# Patient Record
Sex: Male | Born: 1970 | Race: White | Hispanic: No | Marital: Single | Smoking: Former smoker
Health system: Southern US, Community
[De-identification: ages and names within clinical notes are randomized; demographics above are authoritative.]

## PROBLEM LIST (undated history)

## (undated) DIAGNOSIS — R3989 Other symptoms and signs involving the genitourinary system: Secondary | ICD-10-CM

## (undated) DIAGNOSIS — R51 Headache: Secondary | ICD-10-CM

## (undated) DIAGNOSIS — K219 Gastro-esophageal reflux disease without esophagitis: Secondary | ICD-10-CM

## (undated) DIAGNOSIS — E785 Hyperlipidemia, unspecified: Secondary | ICD-10-CM

## (undated) DIAGNOSIS — B019 Varicella without complication: Secondary | ICD-10-CM

## (undated) DIAGNOSIS — K121 Other forms of stomatitis: Secondary | ICD-10-CM

## (undated) DIAGNOSIS — E119 Type 2 diabetes mellitus without complications: Secondary | ICD-10-CM

## (undated) DIAGNOSIS — T7840XA Allergy, unspecified, initial encounter: Secondary | ICD-10-CM

## (undated) DIAGNOSIS — R519 Headache, unspecified: Secondary | ICD-10-CM

## (undated) HISTORY — DX: Gastro-esophageal reflux disease without esophagitis: K21.9

## (undated) HISTORY — DX: Headache: R51

## (undated) HISTORY — DX: Other symptoms and signs involving the genitourinary system: R39.89

## (undated) HISTORY — DX: Allergy, unspecified, initial encounter: T78.40XA

## (undated) HISTORY — DX: Type 2 diabetes mellitus without complications: E11.9

## (undated) HISTORY — DX: Headache, unspecified: R51.9

## (undated) HISTORY — DX: Varicella without complication: B01.9

## (undated) HISTORY — DX: Hyperlipidemia, unspecified: E78.5

## (undated) HISTORY — DX: Other forms of stomatitis: K12.1

---

## 2016-01-10 DIAGNOSIS — E1165 Type 2 diabetes mellitus with hyperglycemia: Secondary | ICD-10-CM | POA: Insufficient documentation

## 2016-01-10 DIAGNOSIS — E782 Mixed hyperlipidemia: Secondary | ICD-10-CM | POA: Insufficient documentation

## 2016-01-10 DIAGNOSIS — Z794 Long term (current) use of insulin: Secondary | ICD-10-CM

## 2016-01-10 DIAGNOSIS — IMO0001 Reserved for inherently not codable concepts without codable children: Secondary | ICD-10-CM | POA: Insufficient documentation

## 2017-12-20 ENCOUNTER — Telehealth: Payer: Self-pay | Admitting: Internal Medicine

## 2017-12-20 NOTE — Telephone Encounter (Signed)
Dr. Posey ReaPlotnikov, are you okay accepting this patient to establish care?

## 2017-12-20 NOTE — Telephone Encounter (Signed)
Copied from CRM [phone]. Topic: Appointment Scheduling - New Patient >> Dec 19, 2017  4:08 PM Windy KalataMichael, Taylor L, NT wrote: Patient is calling and states his friend Ree Edmanaul Giordano is a patient of Dr. Posey ReaPlotnikov and was told he would accept him as a new patient. Please contact patient to schedule.

## 2017-12-25 NOTE — Telephone Encounter (Signed)
Okay. Thank you.

## 2017-12-26 NOTE — Telephone Encounter (Signed)
Left message for patient to call back to schedule. Okay to schedule as an office visit in a 30 minute slot with note: New Patient/ okay per Dr Posey ReaPlotnikov

## 2018-01-25 ENCOUNTER — Ambulatory Visit: Payer: BC Managed Care – PPO | Admitting: Internal Medicine

## 2018-01-25 ENCOUNTER — Encounter: Payer: Self-pay | Admitting: Internal Medicine

## 2018-01-25 ENCOUNTER — Other Ambulatory Visit (INDEPENDENT_AMBULATORY_CARE_PROVIDER_SITE_OTHER): Payer: BC Managed Care – PPO

## 2018-01-25 VITALS — BP 122/76 | HR 81 | Temp 98.6°F | Ht 69.0 in | Wt 186.0 lb

## 2018-01-25 DIAGNOSIS — R5383 Other fatigue: Secondary | ICD-10-CM | POA: Insufficient documentation

## 2018-01-25 DIAGNOSIS — E1165 Type 2 diabetes mellitus with hyperglycemia: Secondary | ICD-10-CM

## 2018-01-25 DIAGNOSIS — R198 Other specified symptoms and signs involving the digestive system and abdomen: Secondary | ICD-10-CM | POA: Diagnosis not present

## 2018-01-25 DIAGNOSIS — IMO0001 Reserved for inherently not codable concepts without codable children: Secondary | ICD-10-CM

## 2018-01-25 DIAGNOSIS — N400 Enlarged prostate without lower urinary tract symptoms: Secondary | ICD-10-CM | POA: Diagnosis not present

## 2018-01-25 DIAGNOSIS — Z794 Long term (current) use of insulin: Secondary | ICD-10-CM | POA: Diagnosis not present

## 2018-01-25 DIAGNOSIS — Z23 Encounter for immunization: Secondary | ICD-10-CM

## 2018-01-25 LAB — URINALYSIS
Bilirubin Urine: NEGATIVE
HGB URINE DIPSTICK: NEGATIVE
Ketones, ur: NEGATIVE
LEUKOCYTES UA: NEGATIVE
NITRITE: NEGATIVE
Total Protein, Urine: NEGATIVE
UROBILINOGEN UA: 0.2 (ref 0.0–1.0)
Urine Glucose: NEGATIVE
pH: 5.5 (ref 5.0–8.0)

## 2018-01-25 LAB — CBC WITH DIFFERENTIAL/PLATELET
BASOS ABS: 0 10*3/uL (ref 0.0–0.1)
Basophils Relative: 1 % (ref 0.0–3.0)
Eosinophils Absolute: 0.2 10*3/uL (ref 0.0–0.7)
Eosinophils Relative: 4 % (ref 0.0–5.0)
HCT: 44.8 % (ref 39.0–52.0)
HEMOGLOBIN: 15 g/dL (ref 13.0–17.0)
LYMPHS ABS: 1.4 10*3/uL (ref 0.7–4.0)
LYMPHS PCT: 35.3 % (ref 12.0–46.0)
MCHC: 33.4 g/dL (ref 30.0–36.0)
MCV: 87.3 fl (ref 78.0–100.0)
MONOS PCT: 13.2 % — AB (ref 3.0–12.0)
Monocytes Absolute: 0.5 10*3/uL (ref 0.1–1.0)
NEUTROS PCT: 46.5 % (ref 43.0–77.0)
Neutro Abs: 1.8 10*3/uL (ref 1.4–7.7)
Platelets: 187 10*3/uL (ref 150.0–400.0)
RBC: 5.13 Mil/uL (ref 4.22–5.81)
RDW: 14.4 % (ref 11.5–15.5)
WBC: 3.9 10*3/uL — AB (ref 4.0–10.5)

## 2018-01-25 LAB — TSH: TSH: 2.09 u[IU]/mL (ref 0.35–4.50)

## 2018-01-25 LAB — BASIC METABOLIC PANEL
BUN: 19 mg/dL (ref 6–23)
CHLORIDE: 103 meq/L (ref 96–112)
CO2: 25 meq/L (ref 19–32)
Calcium: 10.6 mg/dL — ABNORMAL HIGH (ref 8.4–10.5)
Creatinine, Ser: 1 mg/dL (ref 0.40–1.50)
GFR: 84.87 mL/min (ref >60.00)
GLUCOSE: 118 mg/dL — AB (ref 70–99)
Potassium: 4.1 mEq/L (ref 3.5–5.1)
Sodium: 136 mEq/L (ref 135–145)

## 2018-01-25 LAB — HEPATIC FUNCTION PANEL
ALBUMIN: 4.7 g/dL (ref 3.5–5.2)
ALK PHOS: 57 U/L (ref 39–117)
ALT: 60 U/L — ABNORMAL HIGH (ref 0–53)
AST: 31 U/L (ref 0–37)
Bilirubin, Direct: 0.1 mg/dL (ref 0.0–0.3)
TOTAL PROTEIN: 7.2 g/dL (ref 6.0–8.3)
Total Bilirubin: 0.6 mg/dL (ref 0.2–1.2)

## 2018-01-25 LAB — VITAMIN B12: VITAMIN B 12: 936 pg/mL — AB (ref 211–911)

## 2018-01-25 LAB — VITAMIN D 25 HYDROXY (VIT D DEFICIENCY, FRACTURES): VITD: 39.27 ng/mL (ref 30.00–100.00)

## 2018-01-25 LAB — T4, FREE: Free T4: 0.85 ng/dL (ref 0.60–1.60)

## 2018-01-25 LAB — PSA: PSA: 0.94 ng/mL (ref 0.10–4.00)

## 2018-01-25 LAB — SEDIMENTATION RATE: SED RATE: 12 mm/h (ref 0–15)

## 2018-01-25 MED ORDER — DOXYCYCLINE HYCLATE 100 MG PO TABS: 100.0000 mg | ORAL_TABLET | Freq: Two times a day (BID) | ORAL | 0 refills | Status: DC

## 2018-01-25 MED ORDER — ALIGN 4 MG PO CAPS: 1.0000 | ORAL_CAPSULE | Freq: Every day | ORAL | 0 refills | Status: DC

## 2018-01-25 NOTE — Assessment & Plan Note (Signed)
DM 2 2010 Dr Allena KatzPatel Insulin 2011

## 2018-01-25 NOTE — Progress Notes (Signed)
Subjective:  Patient ID: Mitchell Gutierrez, male    DOB: 26-May-1971  Age: 47 y.o. MRN: [id number]  CC: No chief complaint on file.   HPI Giovanni Chrzanowski presents to est [facility] C/o pain in the L groin x 2 months C/o abd bloating C/o prostatitis sx's (h/o prostatitis): urgency, dribbling  C/0 DM2   Outpatient Medications Prior to Visit  Medication Sig Dispense Refill  . Chlorpheniramine Maleate (ALLERGY PO) Take by mouth.    . cholecalciferol (VITAMIN D) 1000 units tablet Take 1,000 Units by mouth daily.    . Dulaglutide 0.75 MG/0.5ML SOPN Inject into the skin.    Marland Kitchen ECHINACEA PO Take by mouth.    Marland Kitchen ibuprofen (ADVIL,MOTRIN) 200 MG tablet Take 200 mg by mouth every 6 (six) hours as needed.    . insulin detemir (LEVEMIR) 100 UNIT/ML injection Inject into the skin.    . Multiple Vitamins-Minerals (ZINC PO) Take by mouth.    . Omega-3 Fatty Acids (FISH OIL PO) Take by mouth.    . vitamin B-12 (CYANOCOBALAMIN) 1000 MCG tablet Take 1,000 mcg by mouth daily.     No facility-administered medications prior to visit.     ROS Review of Systems  Objective:  BP 122/76 (BP Location: Left Arm, Patient Position: Sitting, Cuff Size: Large)   Pulse 81   Temp 98.6 F (37 C) (Oral)   Ht 5\' 9"  (1.753 m)   Wt 186 lb (84.4 kg)   SpO2 98%   BMI 27.47 kg/m   BP Readings from Last 3 Encounters:  01/25/18 122/76    Wt Readings from Last 3 Encounters:  01/25/18 186 lb (84.4 kg)    Physical Exam  No results found for: WBC, HGB, HCT, PLT, GLUCOSE, CHOL, TRIG, HDL, LDLDIRECT, LDLCALC, ALT, AST, NA, K, CL, CREATININE, BUN, CO2, TSH, PSA, INR, GLUF, HGBA1C, MICROALBUR  Patient was never admitted.  Assessment & Plan:   There are no diagnoses linked to this encounter. I am having Harel Emory "Alinda Money" maintain his Dulaglutide, insulin detemir, Multiple Vitamins-Minerals (ZINC PO), vitamin B-12, cholecalciferol, Omega-3 Fatty Acids (FISH OIL PO), Chlorpheniramine Maleate (ALLERGY PO), ibuprofen, and  ECHINACEA PO.  No orders of the defined types were placed in this encounter.    Follow-up: No follow-ups on file.  Sonda Primes, MD

## 2018-01-25 NOTE — Assessment & Plan Note (Signed)
No OSA 

## 2018-01-25 NOTE — Assessment & Plan Note (Signed)
Doxy  X 2 wks

## 2018-01-25 NOTE — Patient Instructions (Signed)
  Gluten free trial for 4-6 weeks. OK to use gluten-free bread and gluten-free pasta.    Gluten-Free Diet for Celiac Disease, Adult The gluten-free diet includes all foods that do not contain gluten. Gluten is a protein that is found in wheat, rye, barley, and some other grains. Following the gluten-free diet is the only treatment for people with celiac disease. It helps to prevent damage to the intestines and improves or eliminates the symptoms of celiac disease. Following the gluten-free diet requires some planning. It can be challenging at first, but it gets easier with time and practice. There are more gluten-free options available today than ever before. If you need help finding gluten-free foods or if you have questions, talk with your diet and nutrition specialist (registered dietitian) or your health care provider. What do I need to know about a gluten-free diet?  All fruits, vegetables, and meats are safe to eat and do not contain gluten.  When grocery shopping, start by shopping in the produce, meat, and dairy sections. These sections are more likely to contain gluten-free foods. Then move to the aisles that contain packaged foods if you need to.  Read all food labels. Gluten is often added to foods. Always check the ingredient list and look for warnings, such as "may contain gluten."  Talk with your dietitian or health care provider before taking a gluten-free multivitamin or mineral supplement.  Be aware of gluten-free foods having contact with foods that contain gluten (cross-contamination). This can happen at home and with any processed foods. ? Talk with your health care provider or dietitian about how to reduce the risk of cross-contamination in your home. ? If you have questions about how a food is processed, ask the manufacturer. What key words help to identify gluten? Foods that list any of these key words on the label usually contain gluten:  Wheat, flour, enriched  flour, bromated flour, white flour, durum flour, graham flour, phosphated flour, self-rising flour, semolina, farina, barley (malt), rye, and oats.  Starch, dextrin, modified food starch, or cereal.  Thickening, fillers, or emulsifiers.  Malt flavoring, malt extract, or malt syrup.  Hydrolyzed vegetable protein.  In the [place] packaged foods that are gluten-free are required to be labeled "GF." These foods should be easy to identify and are safe to eat. In the [place] food companies are also required to list common food allergens, including wheat, on their labels. Recommended foods Grains  Amaranth, bean flours, 100% buckwheat flour, corn, millet, nut flours or nut meals, GF oats, quinoa, rice, sorghum, teff, rice wafers, pure cornmeal tortillas, popcorn, and hot cereals made from cornmeal. Hominy, rice, wild rice. Some Asian rice noodles or bean noodles. Arrowroot starch, corn bran, corn flour, corn germ, cornmeal, corn starch, potato flour, potato starch flour, and rice bran. Plain, brown, and sweet rice flours. Rice polish, soy flour, and tapioca starch. Vegetables  All plain fresh, frozen, and canned vegetables. Fruits  All plain fresh, frozen, canned, and dried fruits, and 100% fruit juices. Meats and other protein foods  All fresh beef, pork, poultry, fish, seafood, and eggs. Fish canned in water, oil, brine, or vegetable broth. Plain nuts and seeds, peanut butter. Some lunch meat and some frankfurters. Dried beans, dried peas, and lentils. Dairy  Fresh plain, dry, evaporated, or condensed milk. Cream, butter, sour cream, whipping cream, and most yogurts. Unprocessed cheese, most processed cheeses, some cottage cheese, some cream cheeses. Beverages  Coffee, tea, most herbal teas. Carbonated beverages and some root beers.   Wine, sake, and distilled spirits, such as gin, vodka, and whiskey. Most hard ciders. Fats and oils  Butter, margarine, vegetable oil, hydrogenated butter, olive  oil, shortening, lard, cream, and some mayonnaise. Some commercial salad dressings. Olives. Sweets and desserts  Sugar, honey, some syrups, molasses, jelly, and jam. Plain hard candy, marshmallows, and gumdrops. Pure cocoa powder. Plain chocolate. Custard and some pudding mixes. Gelatin desserts, sorbets, frozen ice pops, and sherbet. Cake, cookies, and other desserts prepared with allowed flours. Some commercial ice creams. Cornstarch, tapioca, and rice puddings. Seasoning and other foods  Some canned or frozen soups. Monosodium glutamate (MSG). Cider, rice, and wine vinegar. Baking soda and baking powder. Cream of tartar. Baking and nutritional yeast. Certain soy sauces made without wheat (ask your dietitian about specific brands that are allowed). Nuts, coconut, and chocolate. Salt, pepper, herbs, spices, flavoring extracts, imitation or artificial flavorings, natural flavorings, and food colorings. Some medicines and supplements. Some lip glosses and other cosmetics. Rice syrups. The items listed may not be a complete list. Talk with your dietitian about what dietary choices are best for you. Foods to avoid Grains  Barley, bran, bulgur, couscous, cracked wheat, [place], farro, graham, malt, matzo, semolina, wheat germ, and all wheat and rye cereals including spelt and kamut. Cereals containing malt as a flavoring, such as rice cereal. Noodles, spaghetti, macaroni, most packaged rice mixes, and all mixes containing wheat, rye, barley, or triticale. Vegetables  Most creamed vegetables and most vegetables canned in sauces. Some commercially prepared vegetables and salads. Fruits  Thickened or prepared fruits and some pie fillings. Some fruit snacks and fruit roll-ups. Meats and other protein foods  Any meat or meat alternative containing wheat, rye, barley, or gluten stabilizers. These are often marinated or packaged meats and lunch meats. Bread-containing products, such as Swiss steak,  croquettes, meatballs, and meatloaf. Most tuna canned in vegetable broth and turkey with hydrolyzed vegetable protein (HVP) injected as part of the basting. Seitan. Imitation fish. Eggs in sauces made from ingredients to avoid. Dairy  Commercial chocolate milk drinks and malted milk. Some non-dairy creamers. Any cheese product containing ingredients to avoid. Beverages  Certain cereal beverages. Beer, ale, malted milk, and some root beers. Some hard ciders. Some instant flavored coffees. Some herbal teas made with barley or with barley malt added. Fats and oils  Some commercial salad dressings. Sour cream containing modified food starch. Sweets and desserts  Some toffees. Chocolate-coated nuts (may be rolled in wheat flour) and some commercial candies and candy bars. Most cakes, cookies, donuts, pastries, and other baked goods. Some commercial ice cream. Ice cream cones. Commercially prepared mixes for cakes, cookies, and other desserts. Bread pudding and other puddings thickened with flour. Products containing brown rice syrup made with barley malt enzyme. Desserts and sweets made with malt flavoring. Seasoning and other foods  Some curry powders, some dry seasoning mixes, some gravy extracts, some meat sauces, some ketchups, some prepared mustards, and horseradish. Certain soy sauces. Malt vinegar. Bouillon and bouillon cubes that contain HVP. Some chip dips, and some chewing gum. Yeast extract. Brewer's yeast. Caramel color. Some medicines and supplements. Some lip glosses and other cosmetics. The items listed may not be a complete list. Talk with your dietitian about what dietary choices are best for you. Summary  Gluten is a protein that is found in wheat, rye, barley, and some other grains. The gluten-free diet includes all foods that do not contain gluten.  If you need help finding gluten-free foods or if   you have questions, talk with your diet and nutrition specialist (registered  dietitian) or your health care provider.  Read all food labels. Gluten is often added to foods. Always check the ingredient list and look for warnings, such as "may contain gluten." This information is not intended to replace advice given to you by your health care provider. Make sure you discuss any questions you have with your health care provider. Document Released: 09/27/2005 Document Revised: 07/12/2016 Document Reviewed: 07/12/2016 Elsevier Interactive Patient Education  2018 Elsevier Inc.  Lactose Intolerance, Adult Lactose is the natural sugar found in milk and milk products, such as cheese and yogurt. Lactose is digested by lactase, an enzyme in your small intestine. Some people do not produce enough lactase to digest lactose. This is called lactose intolerance. Lactose intolerance is different from milk allergy, which is a more serious reaction to the protein in milk. What are the causes? Causes of lactose intolerance may include:  Normal aging. The ability to produce lactase may decline with age, causing lactose intolerance over time.  Being born without the ability to make lactase.  Digestive diseases such as gastroenteritis or inflammatory bowel disease.  Surgery or injuries to your small intestine.  Infection in your intestines.  Certain antibiotic medicines and cancer treatments.  What are the signs or symptoms? Lactose intolerance can cause uncomfortable symptoms. These are likely to occur within 30 minutes to 2 hours after eating or drinking foods containing lactose. Symptoms of lactose intolerance may include:  Nausea.  Diarrhea.  Abdominal cramps or pain.  Bloating.  Gas.  How is this diagnosed? There are several tests your health care provider can do to diagnose lactose intolerance. These tests include a hydrogen breath test and stool acidity test. How is this treated? No treatment can improve your body's ability to produce lactase. However, your symptoms  can be controlled by limiting or avoiding milk products and other sources of lactose and adjusting your diet. Lactose-free milk is often tolerated. Lactose digestion may also be improved by adding lactase drops to regular milk or by taking lactase tablets when dairy products are consumed. Tolerance to lactose is individual. Some people may be able to eat or drink small amounts of products with lactose, while other may need to avoid lactose entirely. Talk to your health care provider about what is best for you. Follow these instructions at home:  Limit or avoidfoods, beverages, and medicines containing lactose as directed by your health care provider.  Read food and medicine labels carefully to avoid products containing lactose, milk solids, casein, or whey.  If you eliminate dairy products, replace the protein, calcium, vitamin D, and other nutrients they contain through other foods. A registered dietitian or your health care provider can help you adjust your diet.  Choose a milk substitute that is fortified with calcium and vitamin D. Be aware that soy milk contains high quality protein, while milks made from nuts or grains contain very little protein.  Use lactase drops or tablets if directed by your health care provider. Contact a health care provider if: You have no relief from your symptoms after eliminating milk products and other sources of lactose. This information is not intended to replace advice given to you by your health care provider. Make sure you discuss any questions you have with your health care provider. Document Released: 09/27/2005 Document Revised: 03/04/2016 Document Reviewed: 12/28/2013 Elsevier Interactive Patient Education  2018 Elsevier Inc.  

## 2018-01-25 NOTE — Assessment & Plan Note (Signed)
?  etiology Labs 

## 2018-01-26 LAB — HIV ANTIBODY (ROUTINE TESTING W REFLEX): HIV: NONREACTIVE

## 2018-01-31 ENCOUNTER — Other Ambulatory Visit: Payer: Self-pay | Admitting: Internal Medicine

## 2018-01-31 DIAGNOSIS — R7989 Other specified abnormal findings of blood chemistry: Secondary | ICD-10-CM

## 2018-01-31 DIAGNOSIS — R945 Abnormal results of liver function studies: Secondary | ICD-10-CM

## 2018-01-31 DIAGNOSIS — R5383 Other fatigue: Secondary | ICD-10-CM

## 2018-01-31 LAB — GC/CHLAMYDIA PROBE AMP
Chlamydia trachomatis, NAA: NEGATIVE
Neisseria gonorrhoeae by PCR: NEGATIVE

## 2018-01-31 LAB — SPECIMEN STATUS REPORT

## 2018-01-31 NOTE — Addendum Note (Signed)
Addended by: Scarlett PrestoFRIEDENBACH, Charlye Spare on: 01/31/2018 01:42 PM   Modules accepted: Orders

## 2018-02-21 ENCOUNTER — Ambulatory Visit
Admission: RE | Admit: 2018-02-21 | Discharge: 2018-02-21 | Disposition: A | Discharge status: Home / Self Care | Payer: BC Managed Care – PPO | Source: Ambulatory Visit | Attending: Internal Medicine | Admitting: Internal Medicine

## 2018-02-21 DIAGNOSIS — R945 Abnormal results of liver function studies: Principal | ICD-10-CM

## 2018-02-21 DIAGNOSIS — R7989 Other specified abnormal findings of blood chemistry: Secondary | ICD-10-CM

## 2018-03-31 ENCOUNTER — Ambulatory Visit: Payer: BC Managed Care – PPO | Admitting: Internal Medicine

## 2018-04-19 ENCOUNTER — Encounter: Payer: Self-pay | Admitting: Internal Medicine

## 2018-04-19 DIAGNOSIS — E785 Hyperlipidemia, unspecified: Secondary | ICD-10-CM

## 2018-04-20 ENCOUNTER — Other Ambulatory Visit: Payer: Self-pay

## 2018-04-20 ENCOUNTER — Other Ambulatory Visit (INDEPENDENT_AMBULATORY_CARE_PROVIDER_SITE_OTHER): Payer: BC Managed Care – PPO

## 2018-04-20 DIAGNOSIS — R7989 Other specified abnormal findings of blood chemistry: Secondary | ICD-10-CM

## 2018-04-20 DIAGNOSIS — Z794 Long term (current) use of insulin: Secondary | ICD-10-CM | POA: Diagnosis not present

## 2018-04-20 DIAGNOSIS — R945 Abnormal results of liver function studies: Secondary | ICD-10-CM

## 2018-04-20 DIAGNOSIS — E1165 Type 2 diabetes mellitus with hyperglycemia: Secondary | ICD-10-CM

## 2018-04-20 DIAGNOSIS — R5383 Other fatigue: Secondary | ICD-10-CM | POA: Diagnosis not present

## 2018-04-20 DIAGNOSIS — IMO0001 Reserved for inherently not codable concepts without codable children: Secondary | ICD-10-CM

## 2018-04-20 DIAGNOSIS — E785 Hyperlipidemia, unspecified: Secondary | ICD-10-CM

## 2018-04-20 LAB — BASIC METABOLIC PANEL
BUN: 26 mg/dL — AB (ref 6–23)
CO2: 23 meq/L (ref 19–32)
Calcium: 10 mg/dL (ref 8.4–10.5)
Chloride: 106 mEq/L (ref 96–112)
Creatinine, Ser: 1.04 mg/dL (ref 0.40–1.50)
GFR: 81.04 mL/min (ref >60.00)
GLUCOSE: 161 mg/dL — AB (ref 70–99)
POTASSIUM: 4.3 meq/L (ref 3.5–5.1)
Sodium: 137 mEq/L (ref 135–145)

## 2018-04-20 LAB — HEPATIC FUNCTION PANEL
ALT: 31 U/L (ref 0–53)
AST: 17 U/L (ref 0–37)
Albumin: 4.4 g/dL (ref 3.5–5.2)
Alkaline Phosphatase: 56 U/L (ref 39–117)
Bilirubin, Direct: 0 mg/dL (ref 0.0–0.3)
Total Bilirubin: 0.5 mg/dL (ref 0.2–1.2)
Total Protein: 6.8 g/dL (ref 6.0–8.3)

## 2018-04-20 LAB — LIPID PANEL
Cholesterol: 218 mg/dL — ABNORMAL HIGH (ref 0–200)
HDL: 24.2 mg/dL — ABNORMAL LOW (ref >39.00)
Total CHOL/HDL Ratio: 9
Triglycerides: 777 mg/dL — ABNORMAL HIGH (ref 0.0–149.0)

## 2018-04-20 LAB — LDL CHOLESTEROL, DIRECT: Direct LDL: 57 mg/dL

## 2018-04-20 LAB — HEMOGLOBIN A1C: HEMOGLOBIN A1C: 7.8 % — AB (ref 4.6–6.5)

## 2018-04-24 ENCOUNTER — Encounter: Payer: Self-pay | Admitting: Internal Medicine

## 2018-04-24 ENCOUNTER — Ambulatory Visit: Payer: BC Managed Care – PPO | Admitting: Internal Medicine

## 2018-04-24 DIAGNOSIS — IMO0001 Reserved for inherently not codable concepts without codable children: Secondary | ICD-10-CM

## 2018-04-24 DIAGNOSIS — E1165 Type 2 diabetes mellitus with hyperglycemia: Secondary | ICD-10-CM | POA: Diagnosis not present

## 2018-04-24 DIAGNOSIS — Z794 Long term (current) use of insulin: Secondary | ICD-10-CM

## 2018-04-24 DIAGNOSIS — E782 Mixed hyperlipidemia: Secondary | ICD-10-CM | POA: Diagnosis not present

## 2018-04-24 MED ORDER — METFORMIN HCL ER (MOD) 500 MG PO TB24: 500.0000 mg | ORAL_TABLET | Freq: Every day | ORAL | 11 refills | Status: DC

## 2018-04-24 MED ORDER — ICOSAPENT ETHYL 1 G PO CAPS: 2.0000 | ORAL_CAPSULE | Freq: Two times a day (BID) | ORAL | 11 refills | Status: DC

## 2018-04-24 NOTE — Assessment & Plan Note (Addendum)
Elev TG Start Vascepa Added Metformin

## 2018-04-24 NOTE — Assessment & Plan Note (Addendum)
Added Metformin Insulin; Dulaglutide

## 2018-04-24 NOTE — Progress Notes (Signed)
Subjective:  Patient ID: Mitchell Gutierrez, male    DOB: 06-Oct-1971  Age: 47 y.o. MRN: [id number]  CC: No chief complaint on file.   HPI Mitchell Gutierrez presents for DM, elev TG  Outpatient Medications Prior to Visit  Medication Sig Dispense Refill  . Chlorpheniramine Maleate (ALLERGY PO) Take by mouth.    . cholecalciferol (VITAMIN D) 1000 units tablet Take 1,000 Units by mouth daily.    Marland Kitchen doxycycline (VIBRA-TABS) 100 MG tablet Take 1 tablet (100 mg total) by mouth 2 (two) times daily. 28 tablet 0  . Dulaglutide 0.75 MG/0.5ML SOPN Inject into the skin.    Marland Kitchen ECHINACEA PO Take by mouth.    Marland Kitchen ibuprofen (ADVIL,MOTRIN) 200 MG tablet Take 200 mg by mouth every 6 (six) hours as needed.    . insulin detemir (LEVEMIR) 100 UNIT/ML injection Inject into the skin.    . Multiple Vitamins-Minerals (ZINC PO) Take by mouth.    . Omega-3 Fatty Acids (FISH OIL PO) Take by mouth.    . Probiotic Product (ALIGN) 4 MG CAPS Take 1 capsule (4 mg total) by mouth daily. 30 capsule 0  . vitamin B-12 (CYANOCOBALAMIN) 1000 MCG tablet Take 1,000 mcg by mouth daily.     No facility-administered medications prior to visit.     ROS: Review of Systems  Constitutional: Negative for appetite change, fatigue and unexpected weight change.  HENT: Negative for congestion, nosebleeds, sneezing, sore throat and trouble swallowing.   Eyes: Negative for itching and visual disturbance.  Respiratory: Negative for cough.   Cardiovascular: Negative for chest pain, palpitations and leg swelling.  Gastrointestinal: Negative for abdominal distention, blood in stool, diarrhea and nausea.  Genitourinary: Negative for frequency and hematuria.  Musculoskeletal: Negative for back pain, gait problem, joint swelling and neck pain.  Skin: Negative for rash.  Neurological: Negative for dizziness, tremors, speech difficulty and weakness.  Psychiatric/Behavioral: Negative for agitation, dysphoric mood and sleep disturbance. The patient is  not nervous/anxious.     Objective:  BP 128/82 (BP Location: Left Arm, Patient Position: Sitting, Cuff Size: Normal)   Pulse 76   Temp 98.8 F (37.1 C) (Oral)   Ht 5\' 9"  (1.753 m)   Wt 186 lb (84.4 kg)   SpO2 94%   BMI 27.47 kg/m   BP Readings from Last 3 Encounters:  04/24/18 128/82  01/25/18 122/76    Wt Readings from Last 3 Encounters:  04/24/18 186 lb (84.4 kg)  01/25/18 186 lb (84.4 kg)    Physical Exam  Constitutional: He is oriented to person, place, and time. He appears well-developed. No distress.  NAD  HENT:  Mouth/Throat: Oropharynx is clear and moist.  Eyes: Pupils are equal, round, and reactive to light. Conjunctivae are normal.  Neck: Normal range of motion. No JVD present. No thyromegaly present.  Cardiovascular: Normal rate, regular rhythm, normal heart sounds and intact distal pulses. Exam reveals no gallop and no friction rub.  No murmur heard. Pulmonary/Chest: Effort normal and breath sounds normal. No respiratory distress. He has no wheezes. He has no rales. He exhibits no tenderness.  Abdominal: Soft. Bowel sounds are normal. He exhibits no distension and no mass. There is no tenderness. There is no rebound and no guarding.  Musculoskeletal: Normal range of motion. He exhibits no edema or tenderness.  Lymphadenopathy:    He has no cervical adenopathy.  Neurological: He is alert and oriented to person, place, and time. He has normal reflexes. No cranial nerve deficit. He exhibits normal muscle  tone. He displays a negative Romberg sign. Coordination and gait normal.  Skin: Skin is warm and dry. No rash noted.  Psychiatric: He has a normal mood and affect. His behavior is normal. Judgment and thought content normal.    Lab Results  Component Value Date   WBC 3.9 (L) 01/25/2018   HGB 15.0 01/25/2018   HCT 44.8 01/25/2018   PLT 187.0 01/25/2018   GLUCOSE 161 (H) 04/20/2018   CHOL 218 (H) 04/20/2018   TRIG (H) 04/20/2018    777.0 Triglyceride is over  400; calculations on Lipids are invalid.   HDL 24.20 (L) 04/20/2018   LDLDIRECT 57.0 04/20/2018   ALT 31 04/20/2018   AST 17 04/20/2018   NA 137 04/20/2018   K 4.3 04/20/2018   CL 106 04/20/2018   CREATININE 1.04 04/20/2018   BUN 26 (H) 04/20/2018   CO2 23 04/20/2018   TSH 2.09 01/25/2018   PSA 0.94 01/25/2018   HGBA1C 7.8 (H) 04/20/2018    [place] Abdomen Limited Ruq  Result Date: 02/21/2018 CLINICAL DATA:  Elevated liver function studies EXAM: ULTRASOUND ABDOMEN LIMITED RIGHT UPPER QUADRANT COMPARISON:  None. FINDINGS: Gallbladder: No gallstones or wall thickening visualized. No sonographic Murphy sign noted by sonographer. Common bile duct: Diameter: 2.2 mm Liver: The hepatic echotexture is increased diffusely. The surface contour is smooth. There is no focal mass nor ductal dilation. Portal vein is patent on color Doppler imaging with normal direction of blood flow towards the liver. IMPRESSION: Increased hepatic echotexture most compatible with fatty infiltrative change. Normal appearance of the gallbladder and common bile duct. Electronically Signed   By: David  SwazilandJordan M.D.   On: 02/21/2018 12:43    Assessment & Plan:   There are no diagnoses linked to this encounter.   No orders of the defined types were placed in this encounter.    Follow-up: No follow-ups on file.  Sonda PrimesAlex Plotnikov, MD

## 2018-05-03 ENCOUNTER — Other Ambulatory Visit: Payer: BC Managed Care – PPO | Admitting: Internal Medicine

## 2018-07-24 ENCOUNTER — Ambulatory Visit: Payer: BC Managed Care – PPO | Admitting: Internal Medicine

## 2018-08-11 ENCOUNTER — Telehealth: Payer: Self-pay

## 2018-08-11 NOTE — Telephone Encounter (Signed)
PA approved 07/14/18-07/15/19

## 2019-03-20 DIAGNOSIS — H5203 Hypermetropia, bilateral: Secondary | ICD-10-CM | POA: Insufficient documentation

## 2019-03-20 DIAGNOSIS — H524 Presbyopia: Secondary | ICD-10-CM | POA: Insufficient documentation

## 2019-03-20 DIAGNOSIS — Z794 Long term (current) use of insulin: Secondary | ICD-10-CM | POA: Insufficient documentation

## 2019-04-08 IMAGING — US US ABDOMEN LIMITED
1 series · 14 of 25 positions shown · non-contrast
Comparison: None.

CLINICAL DATA: Elevated liver function studies

EXAM:
ULTRASOUND ABDOMEN LIMITED RIGHT UPPER QUADRANT

[Series 1: us abdomen limited · 0.28mm/px · 14 of 41 slices shown]
[im 1/41]
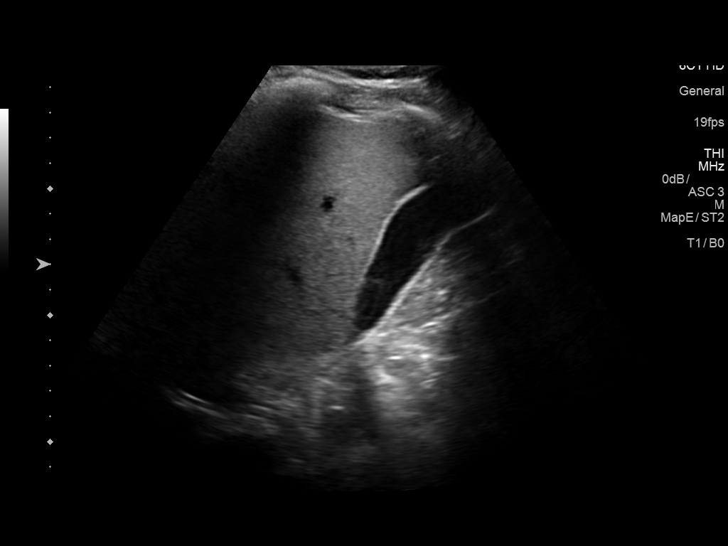
[im 4/41]
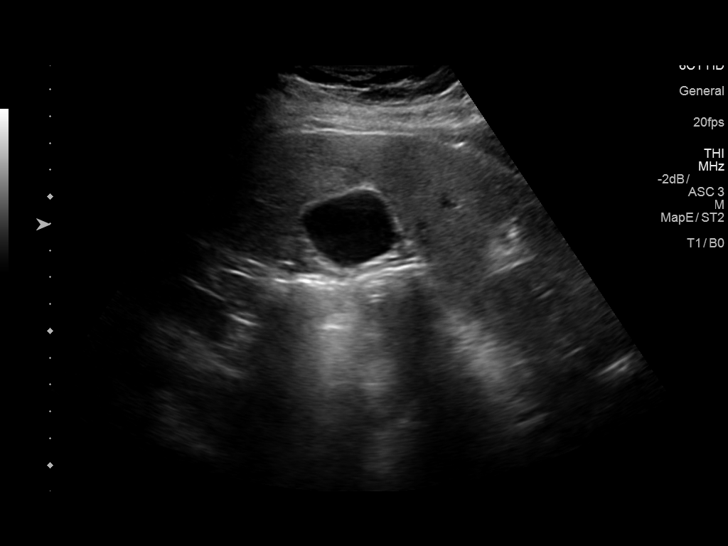
[im 7/41]
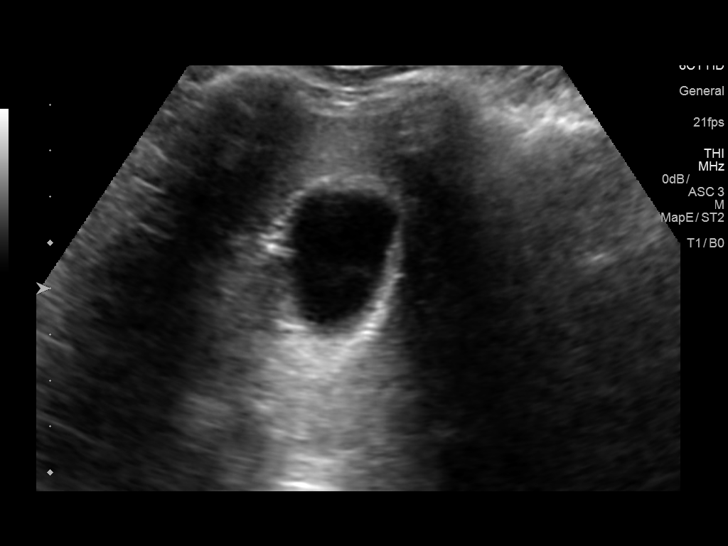
[im 11/41]
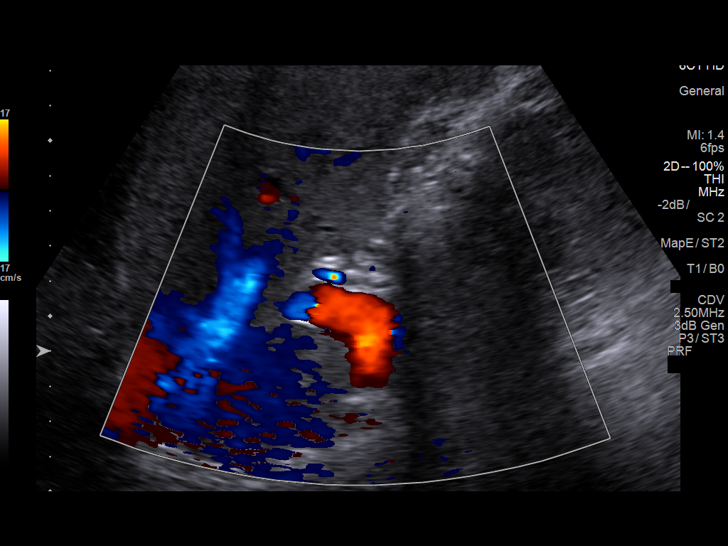
[im 14/41]
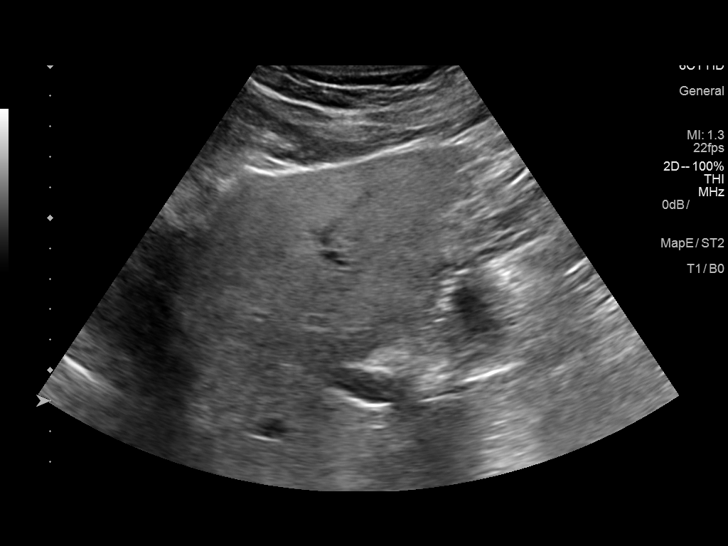
[im 16/41]
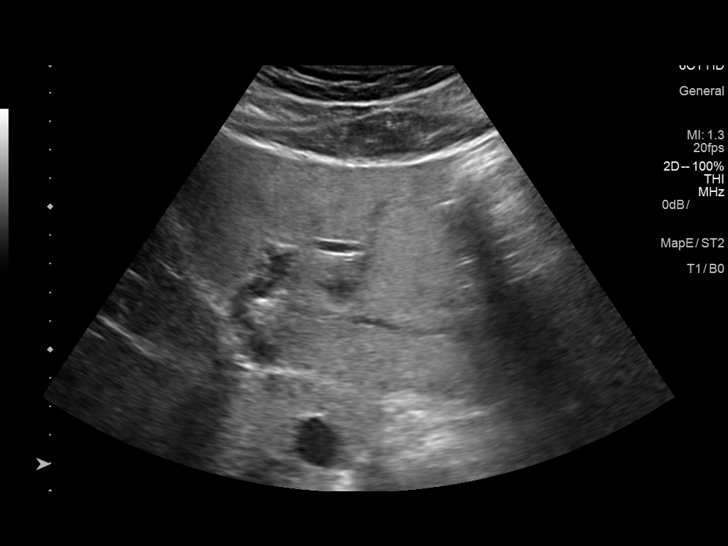
[im 19/41]
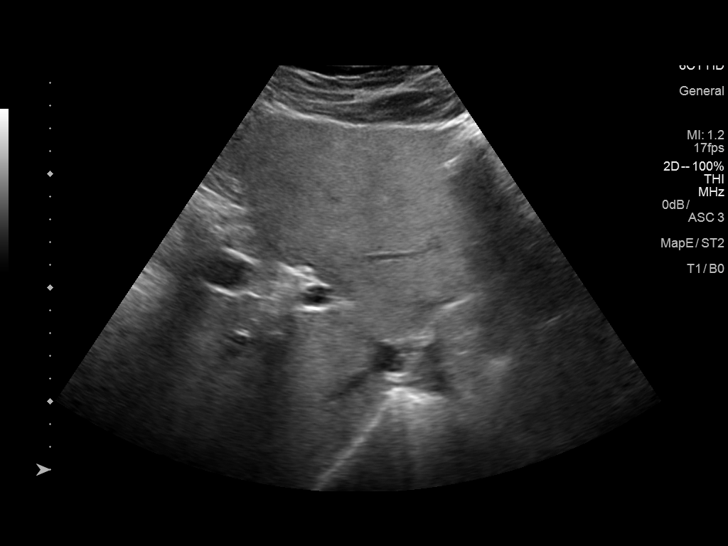
[im 22/41]
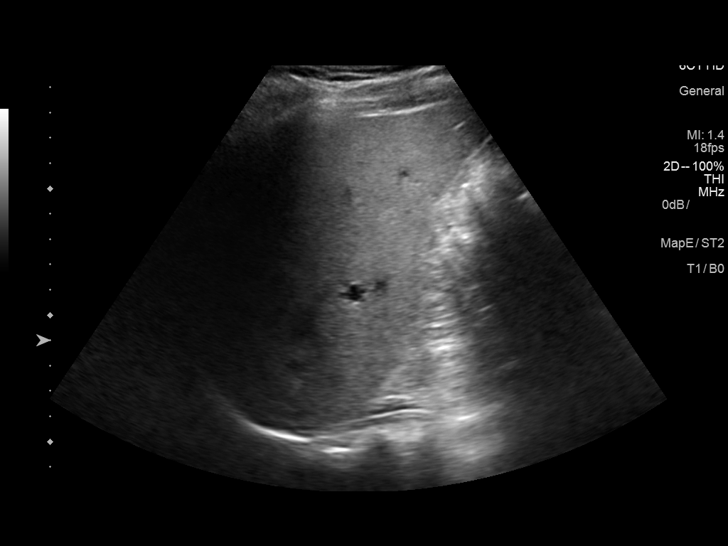
[im 26/41]
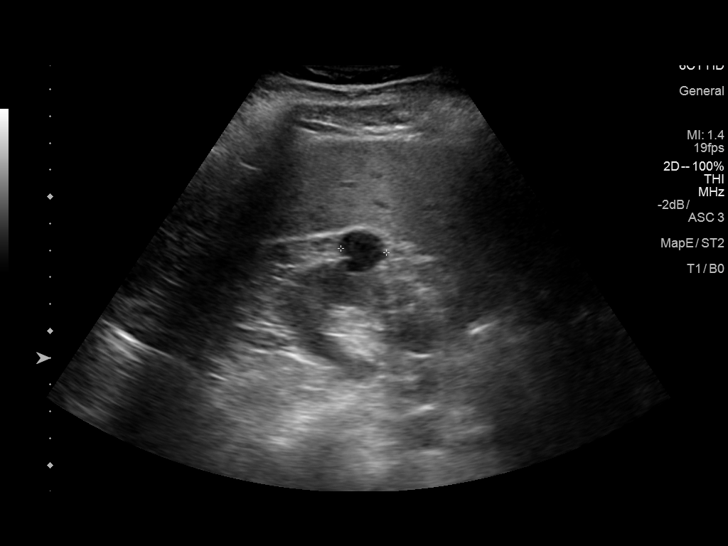
[im 27/41]
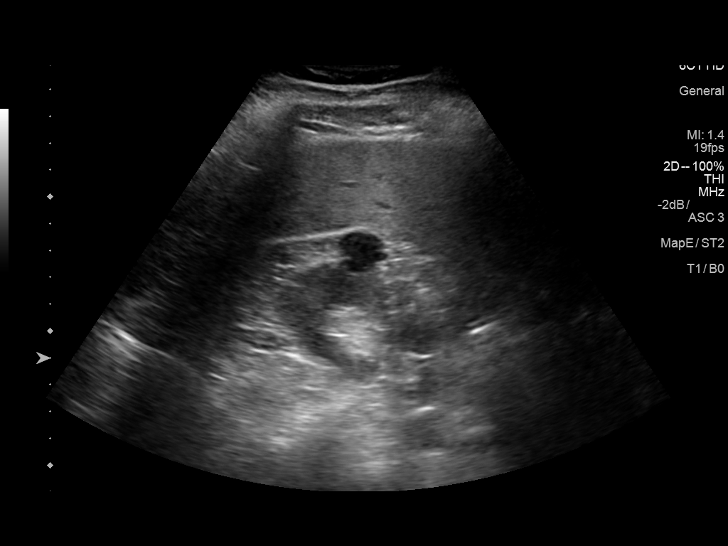
[im 31/41]
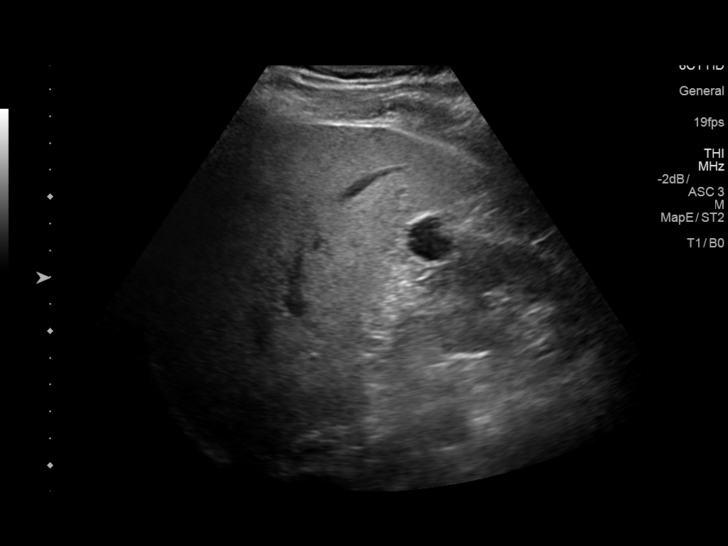
[im 34/41]
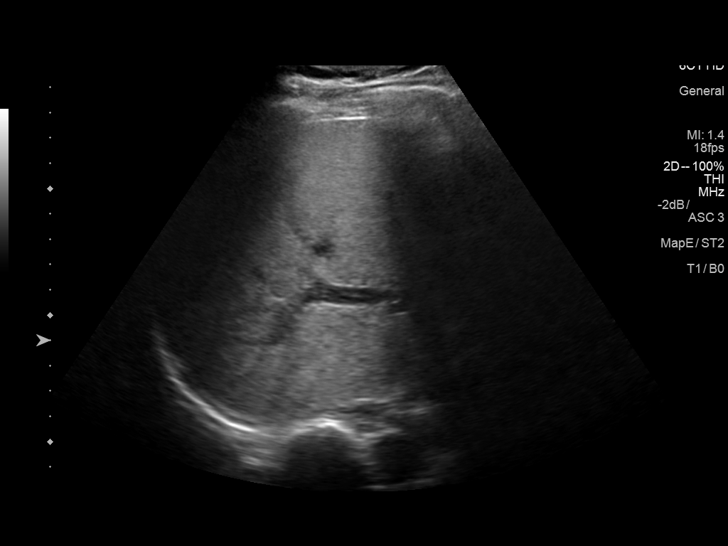
[im 37/41]
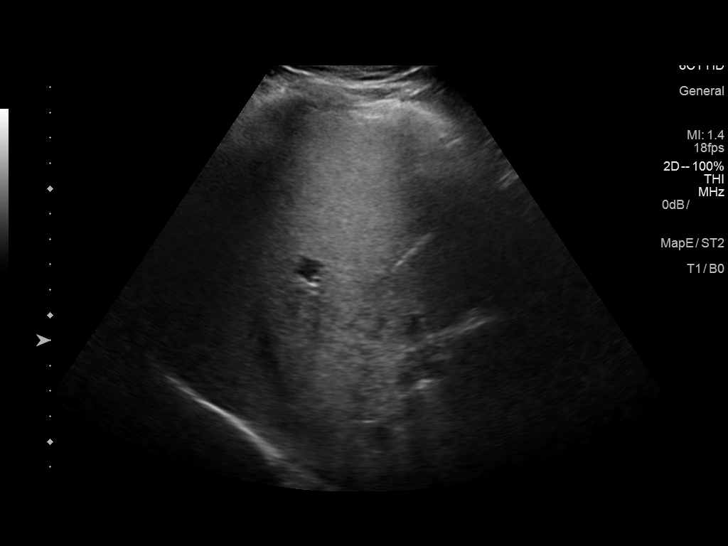
[im 41/41]
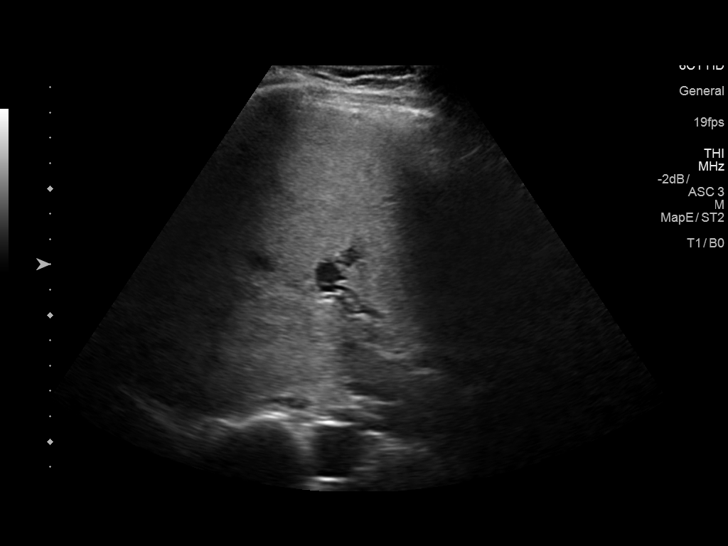

[14 of 25 positions shown; findings below may reference images not displayed]

FINDINGS: Gallbladder:

No gallstones or wall thickening visualized. No sonographic Murphy
sign noted by sonographer.

Common bile duct:

Diameter: 2.2 mm

Liver:

The hepatic echotexture is increased diffusely. The surface contour
is smooth. There is no focal mass nor ductal dilation. Portal vein
is patent on color Doppler imaging with normal direction of blood
flow towards the liver.
IMPRESSION: Increased hepatic echotexture most compatible with fatty
infiltrative change.

Normal appearance of the gallbladder and common bile duct.

## 2019-07-18 ENCOUNTER — Other Ambulatory Visit: Payer: Self-pay

## 2019-07-18 DIAGNOSIS — Z20822 Contact with and (suspected) exposure to covid-19: Secondary | ICD-10-CM

## 2019-07-20 LAB — NOVEL CORONAVIRUS, NAA: SARS-CoV-2, NAA: NOT DETECTED

## 2019-12-21 ENCOUNTER — Ambulatory Visit: Payer: BC Managed Care – PPO | Attending: Internal Medicine

## 2019-12-21 ENCOUNTER — Other Ambulatory Visit: Payer: BC Managed Care – PPO

## 2019-12-21 DIAGNOSIS — Z20822 Contact with and (suspected) exposure to covid-19: Secondary | ICD-10-CM

## 2019-12-22 LAB — NOVEL CORONAVIRUS, NAA: SARS-CoV-2, NAA: NOT DETECTED

## 2019-12-24 ENCOUNTER — Other Ambulatory Visit: Payer: BC Managed Care – PPO

## 2021-07-23 ENCOUNTER — Telehealth: Payer: Self-pay

## 2021-07-23 NOTE — Telephone Encounter (Signed)
PA started for Vascepa   (Key: [id number])

## 2022-11-30 ENCOUNTER — Telehealth: Payer: Self-pay

## 2022-11-30 NOTE — Telephone Encounter (Signed)
Per Dr. Alain Marion can you please add pt to his sched as a new pt and give him a call once sched with the date and time.

## 2022-12-22 ENCOUNTER — Ambulatory Visit: Payer: BC Managed Care – PPO | Admitting: Internal Medicine

## 2022-12-22 ENCOUNTER — Encounter: Payer: Self-pay | Admitting: Internal Medicine

## 2022-12-22 VITALS — BP 120/68 | HR 60 | Temp 98.1°F | Ht 69.0 in | Wt 175.0 lb

## 2022-12-22 DIAGNOSIS — E0822 Diabetes mellitus due to underlying condition with diabetic chronic kidney disease: Secondary | ICD-10-CM

## 2022-12-22 DIAGNOSIS — E782 Mixed hyperlipidemia: Secondary | ICD-10-CM

## 2022-12-22 DIAGNOSIS — R5383 Other fatigue: Secondary | ICD-10-CM | POA: Diagnosis not present

## 2022-12-22 DIAGNOSIS — E559 Vitamin D deficiency, unspecified: Secondary | ICD-10-CM

## 2022-12-22 DIAGNOSIS — E1122 Type 2 diabetes mellitus with diabetic chronic kidney disease: Secondary | ICD-10-CM

## 2022-12-22 DIAGNOSIS — Z794 Long term (current) use of insulin: Secondary | ICD-10-CM | POA: Diagnosis not present

## 2022-12-22 DIAGNOSIS — N182 Chronic kidney disease, stage 2 (mild): Secondary | ICD-10-CM

## 2022-12-22 DIAGNOSIS — E538 Deficiency of other specified B group vitamins: Secondary | ICD-10-CM

## 2022-12-22 DIAGNOSIS — M722 Plantar fascial fibromatosis: Secondary | ICD-10-CM | POA: Insufficient documentation

## 2022-12-22 DIAGNOSIS — Z0000 Encounter for general adult medical examination without abnormal findings: Secondary | ICD-10-CM

## 2022-12-22 LAB — MICROALBUMIN / CREATININE URINE RATIO
Creatinine,U: 89.9 mg/dL
Microalb Creat Ratio: 1.9 mg/g (ref 0.0–30.0)
Microalb, Ur: 1.7 mg/dL (ref 0.0–1.9)

## 2022-12-22 LAB — COMPREHENSIVE METABOLIC PANEL
ALT: 23 U/L (ref 0–53)
AST: 13 U/L (ref 0–37)
Albumin: 4.6 g/dL (ref 3.5–5.2)
Alkaline Phosphatase: 57 U/L (ref 39–117)
BUN: 23 mg/dL (ref 6–23)
CO2: 26 mEq/L (ref 19–32)
Calcium: 10.3 mg/dL (ref 8.4–10.5)
Chloride: 104 mEq/L (ref 96–112)
Creatinine, Ser: 1.14 mg/dL (ref 0.40–1.50)
GFR: 73.89 mL/min (ref >60.00)
Glucose, Bld: 269 mg/dL — ABNORMAL HIGH (ref 70–99)
Potassium: 4.6 mEq/L (ref 3.5–5.1)
Sodium: 136 mEq/L (ref 135–145)
Total Bilirubin: 0.6 mg/dL (ref 0.2–1.2)
Total Protein: 6.5 g/dL (ref 6.0–8.3)

## 2022-12-22 LAB — CBC WITH DIFFERENTIAL/PLATELET
Basophils Absolute: 0 10*3/uL (ref 0.0–0.1)
Basophils Relative: 1.5 % (ref 0.0–3.0)
Eosinophils Absolute: 0.2 10*3/uL (ref 0.0–0.7)
Eosinophils Relative: 6.7 % — ABNORMAL HIGH (ref 0.0–5.0)
HCT: 44.9 % (ref 39.0–52.0)
Hemoglobin: 14.8 g/dL (ref 13.0–17.0)
Lymphocytes Relative: 36.5 % (ref 12.0–46.0)
Lymphs Abs: 1.1 10*3/uL (ref 0.7–4.0)
MCHC: 32.9 g/dL (ref 30.0–36.0)
MCV: 87.7 fl (ref 78.0–100.0)
Monocytes Absolute: 0.4 10*3/uL (ref 0.1–1.0)
Monocytes Relative: 13.6 % — ABNORMAL HIGH (ref 3.0–12.0)
Neutro Abs: 1.2 10*3/uL — ABNORMAL LOW (ref 1.4–7.7)
Neutrophils Relative %: 41.7 % — ABNORMAL LOW (ref 43.0–77.0)
Platelets: 184 10*3/uL (ref 150.0–400.0)
RBC: 5.12 Mil/uL (ref 4.22–5.81)
RDW: 14.7 % (ref 11.5–15.5)
WBC: 3 10*3/uL — ABNORMAL LOW (ref 4.0–10.5)

## 2022-12-22 LAB — LDL CHOLESTEROL, DIRECT: Direct LDL: 71 mg/dL

## 2022-12-22 LAB — PSA: PSA: 0.77 ng/mL (ref 0.10–4.00)

## 2022-12-22 LAB — URINALYSIS
Bilirubin Urine: NEGATIVE
Hgb urine dipstick: NEGATIVE
Leukocytes,Ua: NEGATIVE
Nitrite: NEGATIVE
Specific Gravity, Urine: >=1.03 — AB (ref 1.000–1.030)
Total Protein, Urine: NEGATIVE
Urine Glucose: >=1000 — AB
Urobilinogen, UA: 0.2 (ref 0.0–1.0)
pH: 6 (ref 5.0–8.0)

## 2022-12-22 LAB — TESTOSTERONE: Testosterone: 246.96 ng/dL — ABNORMAL LOW (ref 300.00–890.00)

## 2022-12-22 LAB — LIPID PANEL
Cholesterol: 217 mg/dL — ABNORMAL HIGH (ref 0–200)
HDL: 25.7 mg/dL — ABNORMAL LOW (ref >39.00)
Total CHOL/HDL Ratio: 8
Triglycerides: 605 mg/dL — ABNORMAL HIGH (ref 0.0–149.0)

## 2022-12-22 LAB — TSH: TSH: 2.63 u[IU]/mL (ref 0.35–5.50)

## 2022-12-22 LAB — VITAMIN B12: Vitamin B-12: 165 pg/mL — ABNORMAL LOW (ref 211–911)

## 2022-12-22 LAB — VITAMIN D 25 HYDROXY (VIT D DEFICIENCY, FRACTURES): VITD: 16.36 ng/mL — ABNORMAL LOW (ref 30.00–100.00)

## 2022-12-22 MED ORDER — INSULIN DETEMIR 100 UNIT/ML ~~LOC~~ SOLN: 20.0000 [IU] | Freq: Every day | SUBCUTANEOUS | 3 refills | Status: DC

## 2022-12-22 MED ORDER — FREESTYLE LIBRE 3 SENSOR MISC: 11 refills | Status: DC

## 2022-12-22 NOTE — Progress Notes (Addendum)
Subjective:  Patient ID: Mitchell Gutierrez, male    DOB: 24-Aug-1971  Age: 52 y.o. MRN: [id number]  CC: Establish Care (Concerns about his sugars )   HPI Mitchell Gutierrez presents for a new pt visit C/o DM, recent syncope Complains of fatigue, chronic  Outpatient Medications Prior to Visit  Medication Sig Dispense Refill   Chlorpheniramine Maleate (ALLERGY PO) Take by mouth. (Patient not taking: Reported on 12/22/2022)     cholecalciferol (VITAMIN D) 1000 units tablet Take 1,000 Units by mouth daily. (Patient not taking: Reported on 12/22/2022)     ECHINACEA PO Take by mouth. (Patient not taking: Reported on 12/22/2022)     doxycycline (VIBRA-TABS) 100 MG tablet Take 1 tablet (100 mg total) by mouth 2 (two) times daily. (Patient not taking: Reported on 12/22/2022) 28 tablet 0   Dulaglutide 0.75 MG/0.5ML SOPN Inject into the skin. (Patient not taking: Reported on 12/22/2022)     ibuprofen (ADVIL,MOTRIN) 200 MG tablet Take 200 mg by mouth every 6 (six) hours as needed. (Patient not taking: Reported on 12/22/2022)     Icosapent Ethyl (VASCEPA) 1 g CAPS Take 2 capsules (2 g total) by mouth 2 (two) times daily. (Patient not taking: Reported on 12/22/2022) 120 capsule 11   insulin detemir (LEVEMIR) 100 UNIT/ML injection Inject into the skin. (Patient not taking: Reported on 12/22/2022)     metFORMIN (GLUMETZA) 500 MG (MOD) 24 hr tablet Take 1 tablet (500 mg total) by mouth daily with breakfast. (Patient not taking: Reported on 12/22/2022) 30 tablet 11   Multiple Vitamins-Minerals (ZINC PO) Take by mouth. (Patient not taking: Reported on 12/22/2022)     Omega-3 Fatty Acids (FISH OIL PO) Take by mouth. (Patient not taking: Reported on 12/22/2022)     Probiotic Product (ALIGN) 4 MG CAPS Take 1 capsule (4 mg total) by mouth daily. (Patient not taking: Reported on 12/22/2022) 30 capsule 0   vitamin B-12 (CYANOCOBALAMIN) 1000 MCG tablet Take 1,000 mcg by mouth daily. (Patient not taking: Reported on 12/22/2022)     No  facility-administered medications prior to visit.    ROS: Review of Systems  Constitutional:  Negative for appetite change, fatigue and unexpected weight change.  HENT:  Negative for congestion, nosebleeds, sneezing, sore throat and trouble swallowing.   Eyes:  Negative for itching and visual disturbance.  Respiratory:  Negative for cough.   Cardiovascular:  Negative for chest pain, palpitations and leg swelling.  Gastrointestinal:  Positive for nausea. Negative for abdominal distention, blood in stool and diarrhea.  Genitourinary:  Negative for frequency and hematuria.  Musculoskeletal:  Negative for back pain, gait problem, joint swelling and neck pain.  Skin:  Negative for rash.  Neurological:  Positive for syncope. Negative for dizziness, tremors, speech difficulty and weakness.  Psychiatric/Behavioral:  Negative for agitation, dysphoric mood and sleep disturbance. The patient is not nervous/anxious.     Objective:  BP 120/68 (BP Location: Right Arm, Patient Position: Sitting, Cuff Size: Normal)   Pulse 60   Temp 98.1 F (36.7 C) (Oral)   Ht 5\' 9"  (1.753 m)   Wt 175 lb (79.4 kg)   SpO2 96%   BMI 25.84 kg/m   BP Readings from Last 3 Encounters:  12/22/22 120/68  04/24/18 128/82  01/25/18 122/76    Wt Readings from Last 3 Encounters:  12/22/22 175 lb (79.4 kg)  04/24/18 186 lb (84.4 kg)  01/25/18 186 lb (84.4 kg)    Physical Exam Constitutional:      General: He is  not in acute distress.    Appearance: Normal appearance. He is well-developed.     Comments: NAD  Eyes:     Conjunctiva/sclera: Conjunctivae normal.     Pupils: Pupils are equal, round, and reactive to light.  Neck:     Thyroid: No thyromegaly.     Vascular: No JVD.  Cardiovascular:     Rate and Rhythm: Normal rate and regular rhythm.     Heart sounds: Normal heart sounds. No murmur heard.    No friction rub. No gallop.  Pulmonary:     Effort: Pulmonary effort is normal. No respiratory distress.      Breath sounds: Normal breath sounds. No wheezing or rales.  Chest:     Chest wall: No tenderness.  Abdominal:     General: Bowel sounds are normal. There is no distension.     Palpations: Abdomen is soft. There is no mass.     Tenderness: There is no abdominal tenderness. There is no guarding or rebound.  Musculoskeletal:        General: No tenderness. Normal range of motion.     Cervical back: Normal range of motion.  Lymphadenopathy:     Cervical: No cervical adenopathy.  Skin:    General: Skin is warm and dry.     Findings: No rash.  Neurological:     Mental Status: He is alert and oriented to person, place, and time.     Cranial Nerves: No cranial nerve deficit.     Motor: No abnormal muscle tone.     Coordination: Coordination normal.     Gait: Gait normal.     Deep Tendon Reflexes: Reflexes are normal and symmetric.  Psychiatric:        Behavior: Behavior normal.        Thought Content: Thought content normal.        Judgment: Judgment normal.     Lab Results  Component Value Date   WBC 3.0 (L) 12/22/2022   HGB 14.8 12/22/2022   HCT 44.9 12/22/2022   PLT 184.0 12/22/2022   GLUCOSE 269 (H) 12/22/2022   CHOL 217 (H) 12/22/2022   TRIG (H) 12/22/2022    605.0 Triglyceride is over 400; calculations on Lipids are invalid.   HDL 25.70 (L) 12/22/2022   LDLDIRECT 71.0 12/22/2022   ALT 23 12/22/2022   AST 13 12/22/2022   NA 136 12/22/2022   K 4.6 12/22/2022   CL 104 12/22/2022   CREATININE 1.14 12/22/2022   BUN 23 12/22/2022   CO2 26 12/22/2022   TSH 2.63 12/22/2022   PSA 0.77 12/22/2022   HGBA1C 7.8 (H) 04/20/2018   MICROALBUR 1.7 12/22/2022    US Abdomen Limited RUQ  Result Date: 02/21/2018 CLINICAL DATA:  Elevated liver function studies EXAM: ULTRASOUND ABDOMEN LIMITED RIGHT UPPER QUADRANT COMPARISON:  None. FINDINGS: Gallbladder: No gallstones or wall thickening visualized. No sonographic Murphy sign noted by sonographer. Common bile duct: Diameter: 2.2  mm Liver: The hepatic echotexture is increased diffusely. The surface contour is smooth. There is no focal mass nor ductal dilation. Portal vein is patent on color Doppler imaging with normal direction of blood flow towards the liver. IMPRESSION: Increased hepatic echotexture most compatible with fatty infiltrative change. Normal appearance of the gallbladder and common bile duct. Electronically Signed   By: David  [place] M.D.   On: 02/21/2018 12:43    Assessment & Plan:   Problem List Items Addressed This Visit     Mixed hyperlipidemia    Lipids  Relevant Orders   LDL cholesterol, direct (Completed)   Fatigue - Primary    Improve blood sugar control.  Replace vitamin B12 deficiency Monitor labs       Relevant Orders   TSH (Completed)   Urinalysis (Completed)   CBC with Differential/Platelet (Completed)   Lipid panel (Completed)   Comprehensive metabolic panel (Completed)   PSA (Completed)   Vitamin B12 (Completed)   VITAMIN D 25 Hydroxy (Vit-D Deficiency, Fractures) (Completed)   Testosterone (Completed)   Plantar fasciitis   B12 deficiency    New.  Start on vitamin B12 replacement      Vitamin D deficiency    Replace vitamin D       Other Visit Diagnoses     Type 2 diabetes mellitus with stage 2 chronic kidney disease, with long-term current use of insulin (HCC)       Relevant Medications   insulin detemir (LEVEMIR) 100 UNIT/ML injection   Other Relevant Orders   Microalbumin / creatinine urine ratio (Completed)   LDL cholesterol, direct (Completed)   Well adult exam       Relevant Orders   TSH (Completed)   Urinalysis (Completed)   CBC with Differential/Platelet (Completed)   Lipid panel (Completed)   Comprehensive metabolic panel (Completed)   PSA (Completed)         Meds ordered this encounter  Medications   insulin detemir (LEVEMIR) 100 UNIT/ML injection    Sig: Inject 0.2 mLs (20 Units total) into the skin at bedtime.    Dispense:  10 mL     Refill:  3   Continuous Blood Gluc Sensor (FREESTYLE LIBRE 3 SENSOR) MISC    Sig: Place 1 sensor on the skin every 14 days. Use to check glucose continuously    Dispense:  2 each    Refill:  11      Follow-up: Return in about 3 months (around 03/24/2023) for Wellness Exam.  Sonda Primes, MD

## 2022-12-22 NOTE — Patient Instructions (Signed)
HOKA Spenco insoles

## 2022-12-22 NOTE — Assessment & Plan Note (Signed)
Lipids 

## 2022-12-22 NOTE — Assessment & Plan Note (Addendum)
Improve blood sugar control.  Replace vitamin B12 deficiency Monitor labs

## 2022-12-22 NOTE — Assessment & Plan Note (Addendum)
Uncontrolled.  Re-start Levemir D/c Trulicity due to intolerance Start freestyle libre 3

## 2022-12-23 ENCOUNTER — Encounter: Payer: Self-pay | Admitting: Internal Medicine

## 2022-12-26 ENCOUNTER — Other Ambulatory Visit: Payer: Self-pay | Admitting: Internal Medicine

## 2022-12-26 DIAGNOSIS — E538 Deficiency of other specified B group vitamins: Secondary | ICD-10-CM

## 2022-12-26 DIAGNOSIS — E559 Vitamin D deficiency, unspecified: Secondary | ICD-10-CM | POA: Insufficient documentation

## 2022-12-26 MED ORDER — VITAMIN D3 50 MCG (2000 UT) PO CAPS: 2000.0000 [IU] | ORAL_CAPSULE | Freq: Every day | ORAL | 3 refills | Status: DC

## 2022-12-26 MED ORDER — CYANOCOBALAMIN 1000 MCG/ML IJ SOLN: INTRAMUSCULAR | 6 refills | Status: DC

## 2022-12-26 MED ORDER — "BD ECLIPSE SYRINGE 25G X 1"" 3 ML MISC": 3 refills | Status: DC

## 2022-12-26 MED ORDER — LANTUS SOLOSTAR 100 UNIT/ML ~~LOC~~ SOPN: 20.0000 [IU] | PEN_INJECTOR | Freq: Every day | SUBCUTANEOUS | 11 refills | Status: DC

## 2022-12-26 MED ORDER — VITAMIN D (ERGOCALCIFEROL) 1.25 MG (50000 UNIT) PO CAPS: 50000.0000 [IU] | ORAL_CAPSULE | ORAL | 0 refills | Status: DC

## 2023-01-03 NOTE — Assessment & Plan Note (Signed)
New.  Start on vitamin B12 replacement

## 2023-01-03 NOTE — Assessment & Plan Note (Signed)
Replace vitamin D 

## 2023-02-18 ENCOUNTER — Other Ambulatory Visit: Payer: Self-pay | Admitting: Internal Medicine

## 2023-03-29 ENCOUNTER — Ambulatory Visit (INDEPENDENT_AMBULATORY_CARE_PROVIDER_SITE_OTHER): Payer: BC Managed Care – PPO | Admitting: Internal Medicine

## 2023-03-29 ENCOUNTER — Encounter: Payer: Self-pay | Admitting: Internal Medicine

## 2023-03-29 VITALS — BP 118/78 | HR 65 | Temp 98.0°F | Ht 69.0 in | Wt 184.0 lb

## 2023-03-29 DIAGNOSIS — E1165 Type 2 diabetes mellitus with hyperglycemia: Secondary | ICD-10-CM

## 2023-03-29 DIAGNOSIS — N182 Chronic kidney disease, stage 2 (mild): Secondary | ICD-10-CM | POA: Diagnosis not present

## 2023-03-29 DIAGNOSIS — Z794 Long term (current) use of insulin: Secondary | ICD-10-CM | POA: Diagnosis not present

## 2023-03-29 DIAGNOSIS — E559 Vitamin D deficiency, unspecified: Secondary | ICD-10-CM | POA: Diagnosis not present

## 2023-03-29 DIAGNOSIS — E1122 Type 2 diabetes mellitus with diabetic chronic kidney disease: Secondary | ICD-10-CM

## 2023-03-29 DIAGNOSIS — R5383 Other fatigue: Secondary | ICD-10-CM

## 2023-03-29 DIAGNOSIS — E538 Deficiency of other specified B group vitamins: Secondary | ICD-10-CM | POA: Diagnosis not present

## 2023-03-29 LAB — HEMOGLOBIN A1C: Hgb A1c MFr Bld: 9.4 % — ABNORMAL HIGH (ref 4.6–6.5)

## 2023-03-29 LAB — VITAMIN B12: Vitamin B-12: 559 pg/mL (ref 211–911)

## 2023-03-29 LAB — VITAMIN D 25 HYDROXY (VIT D DEFICIENCY, FRACTURES): VITD: 58.16 ng/mL (ref 30.00–100.00)

## 2023-03-29 MED ORDER — INSULIN LISPRO 100 UNIT/ML IJ SOLN: INTRAMUSCULAR | 11 refills | Status: DC

## 2023-03-29 NOTE — Assessment & Plan Note (Signed)
On Vit D 

## 2023-03-29 NOTE — Assessment & Plan Note (Signed)
Titrate up Lantus Start Humalog SS Cont w/CGM

## 2023-03-29 NOTE — Progress Notes (Signed)
Subjective:  Patient ID: Mitchell Gutierrez, male    DOB: 05/14/1971  Age: 52 y.o. MRN: [id number]  CC: Annual Exam (PHYSICAL)   HPI Mitchell Gutierrez presents for DM, B12 and Vit D def CBG>200  Outpatient Medications Prior to Visit  Medication Sig Dispense Refill   Cholecalciferol (VITAMIN D3) 50 MCG (2000 UT) CAPS Take 1 capsule (2,000 Units total) by mouth daily. 100 capsule 3   Continuous Blood Gluc Sensor (FREESTYLE LIBRE 3 SENSOR) MISC Place 1 sensor on the skin every 14 days. Use to check glucose continuously 2 each 11   cyanocobalamin (VITAMIN B12) 1000 MCG/ML injection Give 1000 mcg of Vitamin b12 sq daily x 1 week, then weekly for 1 month, then once every 2 weeks 10 mL 6   insulin glargine (LANTUS SOLOSTAR) 100 UNIT/ML Solostar Pen Inject 20 Units into the skin at bedtime. 15 mL 11   SYRINGE-NEEDLE, DISP, 3 ML (BD ECLIPSE SYRINGE) 25G X 1" 3 ML MISC As directed for vitamin B12 injections 50 each 3   insulin detemir (LEVEMIR) 100 UNIT/ML injection Inject 0.2 mLs (20 Units total) into the skin at bedtime. 10 mL 3   Chlorpheniramine Maleate (ALLERGY PO) Take by mouth. (Patient not taking: Reported on 12/22/2022)     cholecalciferol (VITAMIN D) 1000 units tablet Take 1,000 Units by mouth daily. (Patient not taking: Reported on 12/22/2022)     ECHINACEA PO Take by mouth. (Patient not taking: Reported on 12/22/2022)     Vitamin D, Ergocalciferol, (DRISDOL) 1.25 MG (50000 UNIT) CAPS capsule Take 1 capsule (50,000 Units total) by mouth every 7 (seven) days. 8 capsule 0   No facility-administered medications prior to visit.    ROS: Review of Systems  Constitutional:  Positive for fatigue and unexpected weight change. Negative for appetite change.  HENT:  Negative for congestion, nosebleeds, sneezing, sore throat and trouble swallowing.   Eyes:  Negative for itching and visual disturbance.  Respiratory:  Negative for cough.   Cardiovascular:  Negative for chest pain, palpitations and leg  swelling.  Gastrointestinal:  Negative for abdominal distention, blood in stool, diarrhea and nausea.  Genitourinary:  Negative for frequency and hematuria.  Musculoskeletal:  Negative for back pain, gait problem, joint swelling and neck pain.  Skin:  Negative for rash.  Neurological:  Negative for dizziness, tremors, speech difficulty and weakness.  Psychiatric/Behavioral:  Negative for agitation, dysphoric mood and sleep disturbance. The patient is not nervous/anxious.     Objective:  BP 118/78 (BP Location: Left Arm, Patient Position: Sitting, Cuff Size: Large)   Pulse 65   Temp 98 F (36.7 C) (Oral)   Ht 5\' 9"  (1.753 m)   Wt 184 lb (83.5 kg)   SpO2 95%   BMI 27.17 kg/m   BP Readings from Last 3 Encounters:  03/29/23 118/78  12/22/22 120/68  04/24/18 128/82    Wt Readings from Last 3 Encounters:  03/29/23 184 lb (83.5 kg)  12/22/22 175 lb (79.4 kg)  04/24/18 186 lb (84.4 kg)    Physical Exam Constitutional:      General: He is not in acute distress.    Appearance: He is well-developed. He is obese.     Comments: NAD  Eyes:     Conjunctiva/sclera: Conjunctivae normal.     Pupils: Pupils are equal, round, and reactive to light.  Neck:     Thyroid: No thyromegaly.     Vascular: No JVD.  Cardiovascular:     Rate and Rhythm: Normal rate and regular  rhythm.     Heart sounds: Normal heart sounds. No murmur heard.    No friction rub. No gallop.  Pulmonary:     Effort: Pulmonary effort is normal. No respiratory distress.     Breath sounds: Normal breath sounds. No wheezing or rales.  Chest:     Chest wall: No tenderness.  Abdominal:     General: Bowel sounds are normal. There is no distension.     Palpations: Abdomen is soft. There is no mass.     Tenderness: There is no abdominal tenderness. There is no guarding or rebound.  Musculoskeletal:        General: No tenderness. Normal range of motion.     Cervical back: Normal range of motion.  Lymphadenopathy:      Cervical: No cervical adenopathy.  Skin:    General: Skin is warm and dry.     Findings: No rash.  Neurological:     Mental Status: He is alert and oriented to person, place, and time.     Cranial Nerves: No cranial nerve deficit.     Motor: No abnormal muscle tone.     Coordination: Coordination normal.     Gait: Gait normal.     Deep Tendon Reflexes: Reflexes are normal and symmetric.  Psychiatric:        Behavior: Behavior normal.        Thought Content: Thought content normal.        Judgment: Judgment normal.     Lab Results  Component Value Date   WBC 3.0 (L) 12/22/2022   HGB 14.8 12/22/2022   HCT 44.9 12/22/2022   PLT 184.0 12/22/2022   GLUCOSE 269 (H) 12/22/2022   CHOL 217 (H) 12/22/2022   TRIG (H) 12/22/2022    605.0 Triglyceride is over 400; calculations on Lipids are invalid.   HDL 25.70 (L) 12/22/2022   LDLDIRECT 71.0 12/22/2022   ALT 23 12/22/2022   AST 13 12/22/2022   NA 136 12/22/2022   K 4.6 12/22/2022   CL 104 12/22/2022   CREATININE 1.14 12/22/2022   BUN 23 12/22/2022   CO2 26 12/22/2022   TSH 2.63 12/22/2022   PSA 0.77 12/22/2022   HGBA1C 9.4 (H) 03/29/2023   MICROALBUR 1.7 12/22/2022    US Abdomen Limited RUQ  Result Date: 02/21/2018 CLINICAL DATA:  Elevated liver function studies EXAM: ULTRASOUND ABDOMEN LIMITED RIGHT UPPER QUADRANT COMPARISON:  None. FINDINGS: Gallbladder: No gallstones or wall thickening visualized. No sonographic Murphy sign noted by sonographer. Common bile duct: Diameter: 2.2 mm Liver: The hepatic echotexture is increased diffusely. The surface contour is smooth. There is no focal mass nor ductal dilation. Portal vein is patent on color Doppler imaging with normal direction of blood flow towards the liver. IMPRESSION: Increased hepatic echotexture most compatible with fatty infiltrative change. Normal appearance of the gallbladder and common bile duct. Electronically Signed   By: David  [place] M.D.   On: 02/21/2018 12:43     Assessment & Plan:   Problem List Items Addressed This Visit     Uncontrolled type 2 diabetes mellitus with hyperglycemia (HCC)    Titrate up Lantus Start Humalog SS Cont w/CGM      Relevant Medications   insulin lispro (HUMALOG) 100 UNIT/ML injection   Fatigue    No change      B12 deficiency    On B12 sq q 2 wks      Relevant Orders   Vitamin B12 (Completed)   Vitamin D deficiency  On Vit D      Relevant Orders   VITAMIN D 25 Hydroxy (Vit-D Deficiency, Fractures) (Completed)   Other Visit Diagnoses     Type 2 diabetes mellitus with stage 2 chronic kidney disease, with long-term current use of insulin (HCC)    -  Primary   Relevant Medications   insulin lispro (HUMALOG) 100 UNIT/ML injection   Other Relevant Orders   Hemoglobin A1c   Hemoglobin A1c (Completed)         Meds ordered this encounter  Medications   insulin lispro (HUMALOG) 100 UNIT/ML injection    Sig: Insulin (Humalog) sliding scale for additional Humalog: Sugar <150 - no additional units Sugar <151-200 - 2 additional units Sugar <200-250 - 6 additional units Sugar <251-300 - 8 additional units Sugar <351-350 - 10 additional units    Dispense:  10 mL    Refill:  11      Follow-up: Return in about 3 months (around 06/29/2023) for a follow-up visit.  Sonda Primes, MD

## 2023-03-29 NOTE — Assessment & Plan Note (Signed)
On B12 sq q 2 wks 

## 2023-03-29 NOTE — Assessment & Plan Note (Signed)
No change 

## 2023-05-26 ENCOUNTER — Encounter (INDEPENDENT_AMBULATORY_CARE_PROVIDER_SITE_OTHER): Payer: Self-pay

## 2023-06-29 ENCOUNTER — Encounter: Payer: Self-pay | Admitting: Internal Medicine

## 2023-06-29 ENCOUNTER — Ambulatory Visit: Payer: BC Managed Care – PPO | Admitting: Internal Medicine

## 2023-06-29 VITALS — BP 118/78 | HR 67 | Temp 98.0°F | Ht 69.0 in | Wt 167.0 lb

## 2023-06-29 DIAGNOSIS — F439 Reaction to severe stress, unspecified: Secondary | ICD-10-CM | POA: Diagnosis not present

## 2023-06-29 DIAGNOSIS — Z794 Long term (current) use of insulin: Secondary | ICD-10-CM

## 2023-06-29 DIAGNOSIS — E782 Mixed hyperlipidemia: Secondary | ICD-10-CM

## 2023-06-29 DIAGNOSIS — Z23 Encounter for immunization: Secondary | ICD-10-CM

## 2023-06-29 DIAGNOSIS — E538 Deficiency of other specified B group vitamins: Secondary | ICD-10-CM

## 2023-06-29 DIAGNOSIS — E1165 Type 2 diabetes mellitus with hyperglycemia: Secondary | ICD-10-CM | POA: Diagnosis not present

## 2023-06-29 MED ORDER — OMEGA-3-ACID ETHYL ESTERS 1 G PO CAPS: 2.0000 g | ORAL_CAPSULE | Freq: Two times a day (BID) | ORAL | 11 refills | Status: DC

## 2023-06-29 MED ORDER — TRESIBA FLEXTOUCH 100 UNIT/ML ~~LOC~~ SOPN: 30.0000 [IU] | PEN_INJECTOR | Freq: Every day | SUBCUTANEOUS | 11 refills | Status: DC

## 2023-06-29 NOTE — Assessment & Plan Note (Addendum)
Titrate up Lantus - change to [place] if covered On Humalog SS Cont w/CGM CGM A1c>8%

## 2023-06-29 NOTE — Assessment & Plan Note (Signed)
Tony's dad was diagnosed with congestive heart failure.  He is under hospice care.

## 2023-06-29 NOTE — Progress Notes (Signed)
Subjective:  Patient ID: Mitchell Gutierrez, male    DOB: [birth year]  Age: 52 y.o. MRN: [id number]  CC: Follow-up (3 mnth f/u, Lt ear feels as though fluid in it, cut on lt foot)   HPI Mitchell Gutierrez presents for DM, B12 def, dyslipidemia Tony's dad was diagnosed with congestive heart failure.  He is under hospice care. CGM A1c>8%  Outpatient Medications Prior to Visit  Medication Sig Dispense Refill   Cholecalciferol (VITAMIN D3) 50 MCG (2000 UT) CAPS Take 1 capsule (2,000 Units total) by mouth daily. 100 capsule 3   Continuous Blood Gluc Sensor (FREESTYLE LIBRE 3 SENSOR) MISC Place 1 sensor on the skin every 14 days. Use to check glucose continuously 2 each 11   cyanocobalamin (VITAMIN B12) 1000 MCG/ML injection Give 1000 mcg of Vitamin b12 sq daily x 1 week, then weekly for 1 month, then once every 2 weeks 10 mL 6   insulin glargine (LANTUS SOLOSTAR) 100 UNIT/ML Solostar Pen Inject 20 Units into the skin at bedtime. 15 mL 11   insulin lispro (HUMALOG) 100 UNIT/ML injection Insulin (Humalog) sliding scale for additional Humalog: Sugar <150 - no additional units Sugar <151-200 - 2 additional units Sugar <200-250 - 6 additional units Sugar <251-300 - 8 additional units Sugar <351-350 - 10 additional units 10 mL 11   SYRINGE-NEEDLE, DISP, 3 ML (BD ECLIPSE SYRINGE) 25G X 1" 3 ML MISC As directed for vitamin B12 injections 50 each 3   No facility-administered medications prior to visit.    ROS: Review of Systems  Constitutional:  Negative for appetite change, fatigue and unexpected weight change.  HENT:  Negative for congestion, nosebleeds, sneezing, sore throat and trouble swallowing.   Eyes:  Negative for itching and visual disturbance.  Respiratory:  Negative for cough.   Cardiovascular:  Negative for chest pain, palpitations and leg swelling.  Gastrointestinal:  Negative for abdominal distention, blood in stool, diarrhea and nausea.  Genitourinary:  Negative for frequency and hematuria.   Musculoskeletal:  Negative for back pain, gait problem, joint swelling and neck pain.  Skin:  Negative for rash.  Neurological:  Negative for dizziness, tremors, speech difficulty and weakness.  Psychiatric/Behavioral:  Negative for agitation, dysphoric mood, sleep disturbance and suicidal ideas. The patient is not nervous/anxious.     Objective:  BP 118/78 (BP Location: Right Arm, Patient Position: Sitting, Cuff Size: Normal)   Pulse 67   Temp 98 F (36.7 C) (Oral)   Ht 5\' 9"  (1.753 m)   Wt 167 lb (75.8 kg)   SpO2 95%   BMI 24.66 kg/m   BP Readings from Last 3 Encounters:  06/29/23 118/78  03/29/23 118/78  12/22/22 120/68    Wt Readings from Last 3 Encounters:  06/29/23 167 lb (75.8 kg)  03/29/23 184 lb (83.5 kg)  12/22/22 175 lb (79.4 kg)    Physical Exam Constitutional:      General: He is not in acute distress.    Appearance: He is well-developed.     Comments: NAD  Eyes:     Conjunctiva/sclera: Conjunctivae normal.     Pupils: Pupils are equal, round, and reactive to light.  Neck:     Thyroid: No thyromegaly.     Vascular: No JVD.  Cardiovascular:     Rate and Rhythm: Normal rate and regular rhythm.     Heart sounds: Normal heart sounds. No murmur heard.    No friction rub. No gallop.  Pulmonary:     Effort: Pulmonary effort is normal.  No respiratory distress.     Breath sounds: Normal breath sounds. No wheezing or rales.  Chest:     Chest wall: No tenderness.  Abdominal:     General: Bowel sounds are normal. There is no distension.     Palpations: Abdomen is soft. There is no mass.     Tenderness: There is no abdominal tenderness. There is no guarding or rebound.  Musculoskeletal:        General: No tenderness. Normal range of motion.     Cervical back: Normal range of motion.  Lymphadenopathy:     Cervical: No cervical adenopathy.  Skin:    General: Skin is warm and dry.     Findings: No rash.  Neurological:     Mental Status: He is alert and  oriented to person, place, and time.     Cranial Nerves: No cranial nerve deficit.     Motor: No abnormal muscle tone.     Coordination: Coordination normal.     Gait: Gait normal.     Deep Tendon Reflexes: Reflexes are normal and symmetric.  Psychiatric:        Behavior: Behavior normal.        Thought Content: Thought content normal.        Judgment: Judgment normal.     Lab Results  Component Value Date   WBC 3.0 (L) 12/22/2022   HGB 14.8 12/22/2022   HCT 44.9 12/22/2022   PLT 184.0 12/22/2022   GLUCOSE 269 (H) 12/22/2022   CHOL 217 (H) 12/22/2022   TRIG (H) 12/22/2022    605.0 Triglyceride is over 400; calculations on Lipids are invalid.   HDL 25.70 (L) 12/22/2022   LDLDIRECT 71.0 12/22/2022   ALT 23 12/22/2022   AST 13 12/22/2022   NA 136 12/22/2022   K 4.6 12/22/2022   CL 104 12/22/2022   CREATININE 1.14 12/22/2022   BUN 23 12/22/2022   CO2 26 12/22/2022   TSH 2.63 12/22/2022   PSA 0.77 12/22/2022   HGBA1C 9.4 (H) 03/29/2023   MICROALBUR 1.7 12/22/2022    US Abdomen Limited RUQ  Result Date: 02/21/2018 CLINICAL DATA:  Elevated liver function studies EXAM: ULTRASOUND ABDOMEN LIMITED RIGHT UPPER QUADRANT COMPARISON:  None. FINDINGS: Gallbladder: No gallstones or wall thickening visualized. No sonographic Murphy sign noted by sonographer. Common bile duct: Diameter: 2.2 mm Liver: The hepatic echotexture is increased diffusely. The surface contour is smooth. There is no focal mass nor ductal dilation. Portal vein is patent on color Doppler imaging with normal direction of blood flow towards the liver. IMPRESSION: Increased hepatic echotexture most compatible with fatty infiltrative change. Normal appearance of the gallbladder and common bile duct. Electronically Signed   By: David  [place] M.D.   On: 02/21/2018 12:43    Assessment & Plan:   Problem List Items Addressed This Visit     Mixed hyperlipidemia    Start lovaza      Relevant Medications   omega-3 acid  ethyl esters (LOVAZA) 1 g capsule   Other Relevant Orders   Lipid panel   Uncontrolled type 2 diabetes mellitus with hyperglycemia (HCC) - Primary    Titrate up Lantus - change to [place] if covered On Humalog SS Cont w/CGM CGM A1c>8%      Relevant Medications   insulin degludec (TRESIBA FLEXTOUCH) 100 UNIT/ML FlexTouch Pen   Other Relevant Orders   Hemoglobin A1c   Comprehensive metabolic panel   E95 deficiency    On B12 sq q 2 wks  Stress    Tony's dad was diagnosed with congestive heart failure.  He is under hospice care.      Other Visit Diagnoses     Need for influenza vaccination       Relevant Orders   Flu vaccine trivalent PF, 6mos and older(Flulaval,Afluria,Fluarix,Fluzone) (Completed)         Meds ordered this encounter  Medications   insulin degludec (TRESIBA FLEXTOUCH) 100 UNIT/ML FlexTouch Pen    Sig: Inject 30 Units into the skin daily.    Dispense:  9 mL    Refill:  11   omega-3 acid ethyl esters (LOVAZA) 1 g capsule    Sig: Take 2 capsules (2 g total) by mouth 2 (two) times daily.    Dispense:  120 capsule    Refill:  11      Follow-up: Return in about 3 months (around 09/28/2023) for a follow-up visit.  Sonda Primes, MD

## 2023-06-29 NOTE — Assessment & Plan Note (Signed)
Start lovaza

## 2023-06-29 NOTE — Assessment & Plan Note (Signed)
On B12 sq q 2 wks

## 2023-07-05 ENCOUNTER — Telehealth: Payer: Self-pay

## 2023-07-05 ENCOUNTER — Other Ambulatory Visit (HOSPITAL_COMMUNITY): Payer: Self-pay

## 2023-07-05 NOTE — Telephone Encounter (Signed)
Pharmacy Patient Advocate Encounter   Received notification from CoverMyMeds that prior authorization for Generic Lovaza is required/requested.   Insurance verification completed.   The patient is insured through [employer]/ [employer] .   Per test claim:  Vascepa 1 GM (brand name) is preferred by the insurance.  If suggested medication is appropriate, Please send in a new RX and discontinue this one. If not, please advise as to why it's not appropriate so that we may request a Prior Authorization.

## 2023-07-06 ENCOUNTER — Other Ambulatory Visit (HOSPITAL_COMMUNITY): Payer: Self-pay

## 2023-07-06 MED ORDER — ICOSAPENT ETHYL 1 G PO CAPS: 2.0000 g | ORAL_CAPSULE | Freq: Two times a day (BID) | ORAL | 3 refills | Status: AC

## 2023-07-06 NOTE — Addendum Note (Signed)
Addended by: Tresa Garter on: 07/06/2023 11:51 PM   Modules accepted: Orders

## 2023-07-06 NOTE — Telephone Encounter (Signed)
Okay Vascepa.  Thank you

## 2023-08-08 ENCOUNTER — Encounter: Payer: Self-pay | Admitting: Internal Medicine

## 2023-08-09 ENCOUNTER — Other Ambulatory Visit: Payer: Self-pay | Admitting: Internal Medicine

## 2023-08-09 MED ORDER — FREESTYLE LIBRE 3 PLUS SENSOR MISC: 3 refills | Status: DC

## 2023-09-26 ENCOUNTER — Other Ambulatory Visit (INDEPENDENT_AMBULATORY_CARE_PROVIDER_SITE_OTHER): Payer: BC Managed Care – PPO

## 2023-09-26 DIAGNOSIS — E1165 Type 2 diabetes mellitus with hyperglycemia: Secondary | ICD-10-CM | POA: Diagnosis not present

## 2023-09-26 DIAGNOSIS — E782 Mixed hyperlipidemia: Secondary | ICD-10-CM

## 2023-09-26 LAB — COMPREHENSIVE METABOLIC PANEL
ALT: 15 U/L (ref 0–53)
AST: 14 U/L (ref 0–37)
Albumin: 4.5 g/dL (ref 3.5–5.2)
Alkaline Phosphatase: 55 U/L (ref 39–117)
BUN: 18 mg/dL (ref 6–23)
CO2: 26 meq/L (ref 19–32)
Calcium: 10.3 mg/dL (ref 8.4–10.5)
Chloride: 104 meq/L (ref 96–112)
Creatinine, Ser: 1.16 mg/dL (ref 0.40–1.50)
GFR: 71.98 mL/min (ref >60.00)
Glucose, Bld: 162 mg/dL — ABNORMAL HIGH (ref 70–99)
Potassium: 4.1 meq/L (ref 3.5–5.1)
Sodium: 137 meq/L (ref 135–145)
Total Bilirubin: 0.5 mg/dL (ref 0.2–1.2)
Total Protein: 6.8 g/dL (ref 6.0–8.3)

## 2023-09-26 LAB — LIPID PANEL
Cholesterol: 211 mg/dL — ABNORMAL HIGH (ref 0–200)
HDL: 28.8 mg/dL — ABNORMAL LOW (ref >39.00)
LDL Cholesterol: 115 mg/dL — ABNORMAL HIGH (ref 0–99)
NonHDL: 182.4
Total CHOL/HDL Ratio: 7
Triglycerides: 338 mg/dL — ABNORMAL HIGH (ref 0.0–149.0)
VLDL: 67.6 mg/dL — ABNORMAL HIGH (ref 0.0–40.0)

## 2023-09-26 LAB — HEMOGLOBIN A1C: Hgb A1c MFr Bld: 8.3 % — ABNORMAL HIGH (ref 4.6–6.5)

## 2023-09-28 ENCOUNTER — Ambulatory Visit: Payer: BC Managed Care – PPO | Admitting: Internal Medicine

## 2023-09-29 ENCOUNTER — Encounter: Payer: Self-pay | Admitting: Internal Medicine

## 2023-09-29 ENCOUNTER — Ambulatory Visit: Payer: BC Managed Care – PPO | Admitting: Internal Medicine

## 2023-09-29 VITALS — BP 118/80 | HR 80 | Temp 98.3°F | Ht 69.0 in | Wt 163.0 lb

## 2023-09-29 DIAGNOSIS — E538 Deficiency of other specified B group vitamins: Secondary | ICD-10-CM

## 2023-09-29 DIAGNOSIS — E782 Mixed hyperlipidemia: Secondary | ICD-10-CM | POA: Diagnosis not present

## 2023-09-29 DIAGNOSIS — E559 Vitamin D deficiency, unspecified: Secondary | ICD-10-CM

## 2023-09-29 DIAGNOSIS — E1165 Type 2 diabetes mellitus with hyperglycemia: Secondary | ICD-10-CM | POA: Diagnosis not present

## 2023-09-29 MED ORDER — INSULIN LISPRO (1 UNIT DIAL) 100 UNIT/ML (KWIKPEN): PEN_INJECTOR | SUBCUTANEOUS | 11 refills | Status: DC

## 2023-09-29 MED ORDER — INSULIN PEN NEEDLE 31G X 5 MM MISC: 11 refills | Status: DC

## 2023-09-29 NOTE — Assessment & Plan Note (Signed)
Start lovaza

## 2023-09-29 NOTE — Assessment & Plan Note (Addendum)
Better. On Tresiba On Humalog SS - change to pens Cont w/CGM CGM A1c>8%

## 2023-09-29 NOTE — Assessment & Plan Note (Signed)
On Vit D 

## 2023-09-29 NOTE — Assessment & Plan Note (Signed)
On B12 sq q 2 wks

## 2023-09-29 NOTE — Progress Notes (Signed)
Subjective:  Patient ID: Mitchell Gutierrez, male    DOB: Jan 03, 1971  Age: 52 y.o. MRN: [id number]  CC: Medical Management of Chronic Issues (3 MNTH F/U)   HPI [name] presents for DM, B12 def, dyslipidemia  Outpatient Medications Prior to Visit  Medication Sig Dispense Refill   Cholecalciferol (VITAMIN D3) 50 MCG (2000 UT) CAPS Take 1 capsule (2,000 Units total) by mouth daily. 100 capsule 3   Continuous Glucose Sensor (FREESTYLE LIBRE 3 PLUS SENSOR) MISC Change sensor every 15 days. 6 each 3   cyanocobalamin (VITAMIN B12) 1000 MCG/ML injection Give 1000 mcg of Vitamin b12 sq daily x 1 week, then weekly for 1 month, then once every 2 weeks 10 mL 6   icosapent Ethyl (VASCEPA) 1 g capsule Take 2 capsules (2 g total) by mouth 2 (two) times daily. 360 capsule 3   insulin degludec (TRESIBA FLEXTOUCH) 100 UNIT/ML FlexTouch Pen Inject 30 Units into the skin daily. 9 mL 11   insulin glargine (LANTUS SOLOSTAR) 100 UNIT/ML Solostar Pen Inject 20 Units into the skin at bedtime. 15 mL 11   insulin lispro (HUMALOG) 100 UNIT/ML injection Insulin (Humalog) sliding scale for additional Humalog: Sugar <150 - no additional units Sugar <151-200 - 2 additional units Sugar <200-250 - 6 additional units Sugar <251-300 - 8 additional units Sugar <351-350 - 10 additional units 10 mL 11   SYRINGE-NEEDLE, DISP, 3 ML (BD ECLIPSE SYRINGE) 25G X 1" 3 ML MISC As directed for vitamin B12 injections 50 each 3   No facility-administered medications prior to visit.    ROS: Review of Systems  Constitutional:  Negative for appetite change, fatigue and unexpected weight change.  HENT:  Negative for congestion, nosebleeds, sneezing, sore throat and trouble swallowing.   Eyes:  Negative for itching and visual disturbance.  Respiratory:  Negative for cough.   Cardiovascular:  Negative for chest pain, palpitations and leg swelling.  Gastrointestinal:  Negative for abdominal distention, blood in stool, diarrhea and nausea.   Genitourinary:  Negative for frequency and hematuria.  Musculoskeletal:  Negative for back pain, gait problem, joint swelling and neck pain.  Skin:  Negative for rash.  Neurological:  Negative for dizziness, tremors, speech difficulty and weakness.  Psychiatric/Behavioral:  Negative for agitation, dysphoric mood and sleep disturbance. The patient is not nervous/anxious.     Objective:  BP 118/80 (BP Location: Left Arm, Patient Position: Sitting, Cuff Size: Normal)   Pulse 80   Temp 98.3 F (36.8 C) (Oral)   Ht 5\' 9"  (1.753 m)   Wt 163 lb (73.9 kg)   SpO2 98%   BMI 24.07 kg/m   BP Readings from Last 3 Encounters:  09/29/23 118/80  06/29/23 118/78  03/29/23 118/78    Wt Readings from Last 3 Encounters:  09/29/23 163 lb (73.9 kg)  06/29/23 167 lb (75.8 kg)  03/29/23 184 lb (83.5 kg)    Physical Exam Constitutional:      General: He is not in acute distress.    Appearance: Normal appearance. He is well-developed.     Comments: NAD  Eyes:     Conjunctiva/sclera: Conjunctivae normal.     Pupils: Pupils are equal, round, and reactive to light.  Neck:     Thyroid: No thyromegaly.     Vascular: No JVD.  Cardiovascular:     Rate and Rhythm: Normal rate and regular rhythm.     Heart sounds: Normal heart sounds. No murmur heard.    No friction rub. No gallop.  Pulmonary:     Effort: Pulmonary effort is normal. No respiratory distress.     Breath sounds: Normal breath sounds. No wheezing or rales.  Chest:     Chest wall: No tenderness.  Abdominal:     General: Bowel sounds are normal. There is no distension.     Palpations: Abdomen is soft. There is no mass.     Tenderness: There is no abdominal tenderness. There is no guarding or rebound.  Musculoskeletal:        General: No tenderness. Normal range of motion.     Cervical back: Normal range of motion.  Lymphadenopathy:     Cervical: No cervical adenopathy.  Skin:    General: Skin is warm and dry.     Findings: No  rash.  Neurological:     Mental Status: He is alert and oriented to person, place, and time.     Cranial Nerves: No cranial nerve deficit.     Motor: No abnormal muscle tone.     Coordination: Coordination normal.     Gait: Gait normal.     Deep Tendon Reflexes: Reflexes are normal and symmetric.  Psychiatric:        Behavior: Behavior normal.        Thought Content: Thought content normal.        Judgment: Judgment normal.     Lab Results  Component Value Date   WBC 3.0 (L) 12/22/2022   HGB 14.8 12/22/2022   HCT 44.9 12/22/2022   PLT 184.0 12/22/2022   GLUCOSE 162 (H) 09/26/2023   CHOL 211 (H) 09/26/2023   TRIG 338.0 (H) 09/26/2023   HDL 28.80 (L) 09/26/2023   LDLDIRECT 71.0 12/22/2022   LDLCALC 115 (H) 09/26/2023   ALT 15 09/26/2023   AST 14 09/26/2023   NA 137 09/26/2023   K 4.1 09/26/2023   CL 104 09/26/2023   CREATININE 1.16 09/26/2023   BUN 18 09/26/2023   CO2 26 09/26/2023   TSH 2.63 12/22/2022   PSA 0.77 12/22/2022   HGBA1C 8.3 (H) 09/26/2023   MICROALBUR 1.7 12/22/2022    US Abdomen Limited RUQ Result Date: 02/21/2018 CLINICAL DATA:  Elevated liver function studies EXAM: ULTRASOUND ABDOMEN LIMITED RIGHT UPPER QUADRANT COMPARISON:  None. FINDINGS: Gallbladder: No gallstones or wall thickening visualized. No sonographic Murphy sign noted by sonographer. Common bile duct: Diameter: 2.2 mm Liver: The hepatic echotexture is increased diffusely. The surface contour is smooth. There is no focal mass nor ductal dilation. Portal vein is patent on color Doppler imaging with normal direction of blood flow towards the liver. IMPRESSION: Increased hepatic echotexture most compatible with fatty infiltrative change. Normal appearance of the gallbladder and common bile duct. Electronically Signed   By: David  [place] M.D.   On: 02/21/2018 12:43    Assessment & Plan:   Problem List Items Addressed This Visit     Mixed hyperlipidemia   Start lovaza      Relevant Orders    Hemoglobin A1c   Lipid panel   Comprehensive metabolic panel   Uncontrolled type 2 diabetes mellitus with hyperglycemia (HCC) - Primary   Better. On Tresiba On Humalog SS - change to pens Cont w/CGM CGM A1c>8%      Relevant Medications   insulin lispro (HUMALOG KWIKPEN) 100 UNIT/ML KwikPen   Other Relevant Orders   Hemoglobin A1c   Lipid panel   Comprehensive metabolic panel   Y86 deficiency   On B12 sq q 2 wks  Vitamin D deficiency   On Vit D      Relevant Orders   Hemoglobin A1c   Lipid panel   Comprehensive metabolic panel      Meds ordered this encounter  Medications   insulin lispro (HUMALOG KWIKPEN) 100 UNIT/ML KwikPen    Sig: Insulin (Humalog) sliding scale for additional Humalog: Sugar <150 - no additional units Sugar <151-200 - 2 additional units Sugar <200-250 - 6 additional units Sugar <251-300 - 8 additional units Sugar <351-350 - 10 additional units    Dispense:  9 mL    Refill:  11   Insulin Pen Needle 31G X 5 MM MISC    Sig: Use as directed prn    Dispense:  100 each    Refill:  11      Follow-up: Return in about 3 months (around 12/28/2023) for a follow-up visit.  Sonda Primes, MD

## 2023-09-30 ENCOUNTER — Telehealth: Payer: Self-pay | Admitting: Internal Medicine

## 2023-09-30 ENCOUNTER — Telehealth: Payer: Self-pay

## 2023-09-30 NOTE — Telephone Encounter (Signed)
CRM message: Mitchell Gutierrez on the line in regards to patient's medication.

## 2023-10-06 ENCOUNTER — Encounter: Payer: Self-pay | Admitting: Internal Medicine

## 2023-10-10 ENCOUNTER — Other Ambulatory Visit: Payer: Self-pay | Admitting: Internal Medicine

## 2023-10-10 MED ORDER — NOVOLOG PENFILL 100 UNIT/ML ~~LOC~~ SOCT: SUBCUTANEOUS | 11 refills | Status: DC

## 2023-10-25 ENCOUNTER — Other Ambulatory Visit (HOSPITAL_COMMUNITY): Payer: Self-pay

## 2023-12-29 ENCOUNTER — Ambulatory Visit (INDEPENDENT_AMBULATORY_CARE_PROVIDER_SITE_OTHER): Payer: BC Managed Care – PPO | Admitting: Internal Medicine

## 2023-12-29 ENCOUNTER — Encounter: Payer: Self-pay | Admitting: Internal Medicine

## 2023-12-29 VITALS — BP 128/78 | HR 81 | Temp 97.6°F | Ht 69.0 in | Wt 189.6 lb

## 2023-12-29 DIAGNOSIS — E782 Mixed hyperlipidemia: Secondary | ICD-10-CM

## 2023-12-29 DIAGNOSIS — E538 Deficiency of other specified B group vitamins: Secondary | ICD-10-CM | POA: Diagnosis not present

## 2023-12-29 DIAGNOSIS — Z794 Long term (current) use of insulin: Secondary | ICD-10-CM | POA: Diagnosis not present

## 2023-12-29 DIAGNOSIS — E1165 Type 2 diabetes mellitus with hyperglycemia: Secondary | ICD-10-CM

## 2023-12-29 MED ORDER — NOVOLOG FLEXPEN 100 UNIT/ML ~~LOC~~ SOPN: PEN_INJECTOR | SUBCUTANEOUS | 11 refills | Status: DC

## 2023-12-29 NOTE — Patient Instructions (Signed)

## 2023-12-29 NOTE — Progress Notes (Signed)
 Subjective:  Patient ID: Mitchell Gutierrez, male    DOB: 12-02-1970  Age: 53 y.o. MRN: [id number]  CC: Medical Management of Chronic Issues (3 Month Follow Up. Would like prescription for novolog pen. Discuss options for cologard/colonoscopy)   HPI Toshiyuki Fredell presents for DM, B12 def, Vit D def  Outpatient Medications Prior to Visit  Medication Sig Dispense Refill   Continuous Glucose Sensor (FREESTYLE LIBRE 3 PLUS SENSOR) MISC Change sensor every 15 days. 6 each 3   cyanocobalamin (VITAMIN B12) 1000 MCG/ML injection Give 1000 mcg of Vitamin b12 sq daily x 1 week, then weekly for 1 month, then once every 2 weeks 10 mL 6   icosapent Ethyl (VASCEPA) 1 g capsule Take 2 capsules (2 g total) by mouth 2 (two) times daily. 360 capsule 3   insulin degludec (TRESIBA FLEXTOUCH) 100 UNIT/ML FlexTouch Pen Inject 30 Units into the skin daily. 9 mL 11   Insulin Pen Needle 31G X 5 MM MISC Use as directed prn 100 each 11   insulin aspart (NOVOLOG PENFILL) cartridge Sugar <150 - no additional units Sugar <151-200 - 2 additional units Sugar <200-250 - 6 additional units Sugar <251-300 - 8 additional units Sugar <351-350 - 10 additional units 15 mL 11   Cholecalciferol (VITAMIN D3) 50 MCG (2000 UT) CAPS Take 1 capsule (2,000 Units total) by mouth daily. 100 capsule 3   No facility-administered medications prior to visit.    ROS: Review of Systems  Constitutional:  Negative for appetite change, fatigue and unexpected weight change.  HENT:  Negative for congestion, nosebleeds, sneezing, sore throat and trouble swallowing.   Eyes:  Negative for itching and visual disturbance.  Respiratory:  Negative for cough.   Cardiovascular:  Negative for chest pain, palpitations and leg swelling.  Gastrointestinal:  Negative for abdominal distention, blood in stool, diarrhea and nausea.  Genitourinary:  Negative for frequency and hematuria.  Musculoskeletal:  Negative for back pain, gait problem, joint swelling and  neck pain.  Skin:  Negative for rash.  Neurological:  Negative for dizziness, tremors, speech difficulty and weakness.  Psychiatric/Behavioral:  Negative for agitation, dysphoric mood and sleep disturbance. The patient is not nervous/anxious.     Objective:  BP 128/78   Pulse 81   Temp 97.6 F (36.4 C)   Ht 5\' 9"  (1.753 m)   Wt 189 lb 9.6 oz (86 kg)   SpO2 94%   BMI 28.00 kg/m   BP Readings from Last 3 Encounters:  12/29/23 128/78  09/29/23 118/80  06/29/23 118/78    Wt Readings from Last 3 Encounters:  12/29/23 189 lb 9.6 oz (86 kg)  09/29/23 163 lb (73.9 kg)  06/29/23 167 lb (75.8 kg)    Physical Exam Constitutional:      General: He is not in acute distress.    Appearance: He is well-developed.     Comments: NAD  Eyes:     Conjunctiva/sclera: Conjunctivae normal.     Pupils: Pupils are equal, round, and reactive to light.  Neck:     Thyroid: No thyromegaly.     Vascular: No JVD.  Cardiovascular:     Rate and Rhythm: Normal rate and regular rhythm.     Heart sounds: Normal heart sounds. No murmur heard.    No friction rub. No gallop.  Pulmonary:     Effort: Pulmonary effort is normal. No respiratory distress.     Breath sounds: Normal breath sounds. No wheezing or rales.  Chest:  Chest wall: No tenderness.  Abdominal:     General: Bowel sounds are normal. There is no distension.     Palpations: Abdomen is soft. There is no mass.     Tenderness: There is no abdominal tenderness. There is no guarding or rebound.  Musculoskeletal:        General: No tenderness. Normal range of motion.     Cervical back: Normal range of motion.  Lymphadenopathy:     Cervical: No cervical adenopathy.  Skin:    General: Skin is warm and dry.     Findings: No rash.  Neurological:     Mental Status: He is alert and oriented to person, place, and time.     Cranial Nerves: No cranial nerve deficit.     Motor: No abnormal muscle tone.     Coordination: Coordination normal.      Gait: Gait normal.     Deep Tendon Reflexes: Reflexes are normal and symmetric.  Psychiatric:        Behavior: Behavior normal.        Thought Content: Thought content normal.        Judgment: Judgment normal.     Lab Results  Component Value Date   WBC 3.0 (L) 12/22/2022   HGB 14.8 12/22/2022   HCT 44.9 12/22/2022   PLT 184.0 12/22/2022   GLUCOSE 162 (H) 09/26/2023   CHOL 211 (H) 09/26/2023   TRIG 338.0 (H) 09/26/2023   HDL 28.80 (L) 09/26/2023   LDLDIRECT 71.0 12/22/2022   LDLCALC 115 (H) 09/26/2023   ALT 15 09/26/2023   AST 14 09/26/2023   NA 137 09/26/2023   K 4.1 09/26/2023   CL 104 09/26/2023   CREATININE 1.16 09/26/2023   BUN 18 09/26/2023   CO2 26 09/26/2023   TSH 2.63 12/22/2022   PSA 0.77 12/22/2022   HGBA1C 8.3 (H) 09/26/2023   MICROALBUR 1.7 12/22/2022    US Abdomen Limited RUQ Result Date: 02/21/2018 CLINICAL DATA:  Elevated liver function studies EXAM: ULTRASOUND ABDOMEN LIMITED RIGHT UPPER QUADRANT COMPARISON:  None. FINDINGS: Gallbladder: No gallstones or wall thickening visualized. No sonographic Murphy sign noted by sonographer. Common bile duct: Diameter: 2.2 mm Liver: The hepatic echotexture is increased diffusely. The surface contour is smooth. There is no focal mass nor ductal dilation. Portal vein is patent on color Doppler imaging with normal direction of blood flow towards the liver. IMPRESSION: Increased hepatic echotexture most compatible with fatty infiltrative change. Normal appearance of the gallbladder and common bile duct. Electronically Signed   By: David  [place] M.D.   On: 02/21/2018 12:43    Assessment & Plan:   Problem List Items Addressed This Visit     Mixed hyperlipidemia - Primary   Discussed coronary calcium CT - ordered On Omega3      Relevant Orders   CT CARDIAC SCORING (SELF PAY ONLY)   Uncontrolled type 2 diabetes mellitus with hyperglycemia (HCC)   Better. On Tresiba On Humalog SS - change to pens Cont w/CGM CGM  A1c>8%      Relevant Medications   insulin aspart (NOVOLOG FLEXPEN) 100 UNIT/ML FlexPen   B12 deficiency   On B12 sq q 2 wks         Meds ordered this encounter  Medications   insulin aspart (NOVOLOG FLEXPEN) 100 UNIT/ML FlexPen    Sig: Sugar <150 - no additional units Sugar <151-200 - 2 additional units Sugar <200-250 - 6 additional units Sugar <251-300 - 8 additional units Sugar <351-350 -  10 additional units    Dispense:  15 mL    Refill:  11      Follow-up: No follow-ups on file.  Sonda Primes, MD

## 2023-12-29 NOTE — Assessment & Plan Note (Signed)
On B12 sq q 2 wks

## 2023-12-29 NOTE — Assessment & Plan Note (Signed)
 Better. On Tresiba On Humalog SS - change to pens Cont w/CGM CGM A1c>8%

## 2023-12-29 NOTE — Assessment & Plan Note (Addendum)
 Discussed coronary calcium CT - ordered On Omega3

## 2024-01-10 ENCOUNTER — Telehealth: Payer: Self-pay | Admitting: Internal Medicine

## 2024-01-10 NOTE — Telephone Encounter (Signed)
 Copied from CRM [id number]. Topic: Clinical - Prescription Issue >> Jan 10, 2024  3:20 PM Sonny Dandy B wrote: Reason for CRM: [facility] pham[facility] called to get clarification on insulin aspart (NOVOLOG FLEXPEN) 100 UNIT/ML FlexPen. Requesting a number of max daily units. They are requesting a new prescription with to number of max daily unit to be sent. They stated they can not fill the prescription with out the correct number of daily units. Please call [phone]

## 2024-01-17 MED ORDER — NOVOLOG FLEXPEN 100 UNIT/ML ~~LOC~~ SOPN: PEN_INJECTOR | SUBCUTANEOUS | 11 refills | Status: AC

## 2024-01-17 NOTE — Telephone Encounter (Signed)
Okay.  Corrected.  Thanks

## 2024-01-24 ENCOUNTER — Ambulatory Visit
Admission: RE | Admit: 2024-01-24 | Discharge: 2024-01-24 | Disposition: A | Discharge status: Home / Self Care | Source: Ambulatory Visit | Attending: Internal Medicine | Admitting: Internal Medicine

## 2024-01-24 ENCOUNTER — Other Ambulatory Visit

## 2024-01-24 DIAGNOSIS — E782 Mixed hyperlipidemia: Secondary | ICD-10-CM

## 2024-01-29 ENCOUNTER — Encounter: Payer: Self-pay | Admitting: Internal Medicine

## 2024-04-02 ENCOUNTER — Ambulatory Visit (INDEPENDENT_AMBULATORY_CARE_PROVIDER_SITE_OTHER): Payer: Self-pay | Admitting: Internal Medicine

## 2024-04-02 ENCOUNTER — Encounter: Payer: Self-pay | Admitting: Internal Medicine

## 2024-04-02 VITALS — BP 110/76 | HR 69 | Temp 98.2°F | Ht 69.0 in | Wt 182.0 lb

## 2024-04-02 DIAGNOSIS — E538 Deficiency of other specified B group vitamins: Secondary | ICD-10-CM

## 2024-04-02 DIAGNOSIS — E782 Mixed hyperlipidemia: Secondary | ICD-10-CM | POA: Diagnosis not present

## 2024-04-02 DIAGNOSIS — Z794 Long term (current) use of insulin: Secondary | ICD-10-CM

## 2024-04-02 DIAGNOSIS — E559 Vitamin D deficiency, unspecified: Secondary | ICD-10-CM

## 2024-04-02 DIAGNOSIS — E1165 Type 2 diabetes mellitus with hyperglycemia: Secondary | ICD-10-CM | POA: Diagnosis not present

## 2024-04-02 DIAGNOSIS — Z0000 Encounter for general adult medical examination without abnormal findings: Secondary | ICD-10-CM

## 2024-04-02 DIAGNOSIS — Z125 Encounter for screening for malignant neoplasm of prostate: Secondary | ICD-10-CM

## 2024-04-02 LAB — COMPREHENSIVE METABOLIC PANEL WITH GFR
ALT: 20 U/L (ref 0–53)
AST: 18 U/L (ref 0–37)
Albumin: 4.8 g/dL (ref 3.5–5.2)
Alkaline Phosphatase: 57 U/L (ref 39–117)
BUN: 25 mg/dL — ABNORMAL HIGH (ref 6–23)
CO2: 23 meq/L (ref 19–32)
Calcium: 10 mg/dL (ref 8.4–10.5)
Chloride: 105 meq/L (ref 96–112)
Creatinine, Ser: 1.2 mg/dL (ref 0.40–1.50)
GFR: 68.86 mL/min (ref >60.00)
Glucose, Bld: 172 mg/dL — ABNORMAL HIGH (ref 70–99)
Potassium: 4.2 meq/L (ref 3.5–5.1)
Sodium: 136 meq/L (ref 135–145)
Total Bilirubin: 0.7 mg/dL (ref 0.2–1.2)
Total Protein: 7 g/dL (ref 6.0–8.3)

## 2024-04-02 LAB — HEMOGLOBIN A1C: Hgb A1c MFr Bld: 8.6 % — ABNORMAL HIGH (ref 4.6–6.5)

## 2024-04-02 LAB — PSA: PSA: 0.69 ng/mL (ref 0.10–4.00)

## 2024-04-02 MED ORDER — FREESTYLE LIBRE 3 PLUS SENSOR MISC: 3 refills | Status: DC

## 2024-04-02 NOTE — Assessment & Plan Note (Addendum)
 Better. On Tresiba  On Humalog  SS - change to pens Cont w/CGM CGM A1c>8% Novolog  Penfill

## 2024-04-02 NOTE — Progress Notes (Signed)
 Subjective:  Patient ID: Mitchell Gutierrez, male    DOB: [birth year]  Age: 53 y.o. MRN: [id number]  CC: Medical Management of Chronic Issues (3 MNTH F/U)   HPI Mitchell Gutierrez presents for DM, HTN, B12 def  Outpatient Medications Prior to Visit  Medication Sig Dispense Refill   Continuous Glucose Sensor (FREESTYLE LIBRE 3 PLUS SENSOR) MISC Change sensor every 15 days. 6 each 3   cyanocobalamin  (VITAMIN B12) 1000 MCG/ML injection Give 1000 mcg of Vitamin b12 sq daily x 1 week, then weekly for 1 month, then once every 2 weeks 10 mL 6   icosapent  Ethyl (VASCEPA ) 1 g capsule Take 2 capsules (2 g total) by mouth 2 (two) times daily. 360 capsule 3   insulin  aspart (NOVOLOG  FLEXPEN) 100 UNIT/ML FlexPen Sugar <150 - no additional units Sugar <151-200 - 2 additional units Sugar <200-250 - 6 additional units Sugar <251-300 - 8 additional units Sugar <351-350 - 10 additional units.  Use up to 4 times a day, MAX dose is 40 units/day. 15 mL 11   insulin  degludec (TRESIBA  FLEXTOUCH) 100 UNIT/ML FlexTouch Pen Inject 30 Units into the skin daily. 9 mL 11   Insulin  Pen Needle 31G X 5 MM MISC Use as directed prn 100 each 11   omega-3 acid ethyl esters (LOVAZA ) 1 g capsule Take 2 capsules by mouth 2 (two) times daily.     No facility-administered medications prior to visit.    ROS: Review of Systems  Constitutional:  Negative for appetite change, fatigue and unexpected weight change.  HENT:  Negative for congestion, nosebleeds, sneezing, sore throat and trouble swallowing.   Eyes:  Negative for itching and visual disturbance.  Respiratory:  Negative for cough.   Cardiovascular:  Negative for chest pain, palpitations and leg swelling.  Gastrointestinal:  Negative for abdominal distention, blood in stool, diarrhea and nausea.  Genitourinary:  Negative for frequency and hematuria.  Musculoskeletal:  Negative for back pain, gait problem, joint swelling and neck pain.  Skin:  Negative for rash.  Neurological:   Negative for dizziness, tremors, speech difficulty and weakness.  Psychiatric/Behavioral:  Negative for agitation, dysphoric mood, sleep disturbance and suicidal ideas. The patient is not nervous/anxious.     Objective:  BP 110/76   Pulse 69   Temp 98.2 F (36.8 C) (Oral)   Ht 5' 9 (1.753 m)   Wt 182 lb (82.6 kg)   SpO2 93%   BMI 26.88 kg/m   BP Readings from Last 3 Encounters:  04/02/24 110/76  12/29/23 128/78  09/29/23 118/80    Wt Readings from Last 3 Encounters:  04/02/24 182 lb (82.6 kg)  12/29/23 189 lb 9.6 oz (86 kg)  09/29/23 163 lb (73.9 kg)    Physical Exam Constitutional:      General: He is not in acute distress.    Appearance: He is well-developed.     Comments: NAD   Eyes:     Conjunctiva/sclera: Conjunctivae normal.     Pupils: Pupils are equal, round, and reactive to light.   Neck:     Thyroid : No thyromegaly.     Vascular: No JVD.   Cardiovascular:     Rate and Rhythm: Normal rate and regular rhythm.     Heart sounds: Normal heart sounds. No murmur heard.    No friction rub. No gallop.  Pulmonary:     Effort: Pulmonary effort is normal. No respiratory distress.     Breath sounds: Normal breath sounds. No wheezing or rales.  Chest:     Chest wall: No tenderness.  Abdominal:     General: Bowel sounds are normal. There is no distension.     Palpations: Abdomen is soft. There is no mass.     Tenderness: There is no abdominal tenderness. There is no guarding or rebound.   Musculoskeletal:        General: No tenderness. Normal range of motion.     Cervical back: Normal range of motion.  Lymphadenopathy:     Cervical: No cervical adenopathy.   Skin:    General: Skin is warm and dry.     Findings: No rash.   Neurological:     Mental Status: He is alert and oriented to person, place, and time.     Cranial Nerves: No cranial nerve deficit.     Motor: No abnormal muscle tone.     Coordination: Coordination normal.     Gait: Gait normal.      Deep Tendon Reflexes: Reflexes are normal and symmetric.   Psychiatric:        Behavior: Behavior normal.        Thought Content: Thought content normal.        Judgment: Judgment normal.     Lab Results  Component Value Date   WBC 3.0 (L) 12/22/2022   HGB 14.8 12/22/2022   HCT 44.9 12/22/2022   PLT 184.0 12/22/2022   GLUCOSE 162 (H) 09/26/2023   CHOL 211 (H) 09/26/2023   TRIG 338.0 (H) 09/26/2023   HDL 28.80 (L) 09/26/2023   LDLDIRECT 71.0 12/22/2022   LDLCALC 115 (H) 09/26/2023   ALT 15 09/26/2023   AST 14 09/26/2023   NA 137 09/26/2023   K 4.1 09/26/2023   CL 104 09/26/2023   CREATININE 1.16 09/26/2023   BUN 18 09/26/2023   CO2 26 09/26/2023   TSH 2.63 12/22/2022   PSA 0.77 12/22/2022   HGBA1C 8.3 (H) 09/26/2023   MICROALBUR 1.7 12/22/2022    CT CARDIAC SCORING ([facility] LOCATIONS ONLY) Result Date: 01/24/2024 CLINICAL DATA:  Dyslipidemia, family history * Tracking Code: FCC * EXAM: CT CARDIAC CORONARY ARTERY CALCIUM SCORE TECHNIQUE: Non-contrast imaging through the heart was performed using prospective ECG gating. Image post processing was performed on an independent workstation, allowing for quantitative analysis of the heart and coronary arteries. Note that this exam targets the heart and the chest was not imaged in its entirety. COMPARISON:  None available. FINDINGS: CORONARY CALCIUM SCORES: Left Main: 0 LAD: 0 LCx: 0 RCA/PDA: 0 Total Agatston Score: 0 MESA database percentile: 0 AORTA MEASUREMENTS: Ascending Aorta: 3.1 cm Descending Aorta:2.3 cm OTHER FINDINGS: Heart is normal size. Aorta normal caliber. Calcifications in the aortic arch. No adenopathy. No confluent airspace opacities or effusions. Linear subsegmental atelectasis at the right lung base. No acute findings in the upper abdomen. Low-density area within the superior pole of the spleen measures 1.8 cm, likely cyst or hemangioma. Chest wall soft tissues are unremarkable. No acute bony abnormality. IMPRESSION: No  visible coronary artery calcifications. Total coronary calcium score of 0. Calcifications in the aortic arch. Right basilar atelectasis. No acute extra cardiac abnormality. Electronically Signed   By: Franky Crease M.D.   On: 01/24/2024 23:10    Assessment & Plan:   Problem List Items Addressed This Visit     Mixed hyperlipidemia   Discussed coronary calcium CT - ordered On Omega3      Relevant Medications   omega-3 acid ethyl esters (LOVAZA ) 1 g capsule   Uncontrolled type  2 diabetes mellitus with hyperglycemia (HCC) - Primary   Better. On Tresiba  On Humalog  SS - change to pens Cont w/CGM CGM A1c>8%      B12 deficiency   On B12 sq q 2 wks      Vitamin D  deficiency   On Vit D         No orders of the defined types were placed in this encounter.     Follow-up: Return in about 3 months (around 07/03/2024) for a follow-up visit.  Marolyn Noel, MD

## 2024-04-02 NOTE — Assessment & Plan Note (Signed)
On B12 sq q 2 wks

## 2024-04-02 NOTE — Assessment & Plan Note (Signed)
 On Vit D

## 2024-04-02 NOTE — Assessment & Plan Note (Addendum)
 Coronary calcium CT score is 0 - 2025 On Omega3

## 2024-04-03 ENCOUNTER — Telehealth: Payer: Self-pay

## 2024-04-03 ENCOUNTER — Other Ambulatory Visit (HOSPITAL_COMMUNITY): Payer: Self-pay

## 2024-04-03 ENCOUNTER — Ambulatory Visit: Payer: Self-pay | Admitting: Internal Medicine

## 2024-04-03 DIAGNOSIS — E1165 Type 2 diabetes mellitus with hyperglycemia: Secondary | ICD-10-CM

## 2024-04-03 NOTE — Telephone Encounter (Signed)
 Pharmacy Patient Advocate Encounter   Received notification from CoverMyMeds that prior authorization for [facility] 3 plus sensors is required/requested.   Insurance verification completed.   The patient is insured through [employer] [facility] .   Per test claim:  Dexcom G6 is preferred by the insurance.  If suggested medication is appropriate, Please send in a new RX and discontinue this one. If not, please advise as to why it's not appropriate so that we may request a Prior Authorization. Please note, some preferred medications may still require a PA.  If the suggested medications have not been trialed and there are no contraindications to their use, the PA will not be submitted, as it will not be approved.   Patient insurance will not cover freestyle Libre. A discount card from [employer] brings the 90 day co-pay to $224.99. The Dexcom G6 requires a PA. Please advise

## 2024-04-05 NOTE — Telephone Encounter (Signed)
What do I need to do?  Thanks 

## 2024-04-12 MED ORDER — DEXCOM G6 SENSOR MISC: 3 refills | Status: DC

## 2024-04-12 NOTE — Telephone Encounter (Signed)
 Done. Thanks.

## 2024-04-12 NOTE — Addendum Note (Signed)
 Addended by: Steffani Dionisio V on: 04/12/2024 07:30 AM   Modules accepted: Orders

## 2024-04-16 ENCOUNTER — Telehealth: Payer: Self-pay

## 2024-04-16 ENCOUNTER — Other Ambulatory Visit (HOSPITAL_COMMUNITY): Payer: Self-pay

## 2024-04-16 NOTE — Telephone Encounter (Signed)
 Pharmacy Patient Advocate Encounter   Received notification from Onbase that prior authorization for Dexcom G6 Sensor  is required/requested.   Insurance verification completed.   The patient is insured through [employer] .   Per test claim: PA required; PA submitted to above mentioned insurance via CoverMyMeds Key/confirmation #/EOC [id number] Status is pending

## 2024-04-17 ENCOUNTER — Other Ambulatory Visit: Payer: Self-pay

## 2024-04-17 ENCOUNTER — Other Ambulatory Visit (HOSPITAL_COMMUNITY): Payer: Self-pay

## 2024-04-17 DIAGNOSIS — E1165 Type 2 diabetes mellitus with hyperglycemia: Secondary | ICD-10-CM

## 2024-04-17 DIAGNOSIS — E538 Deficiency of other specified B group vitamins: Secondary | ICD-10-CM

## 2024-04-17 MED ORDER — FREESTYLE LIBRE 3 SENSOR MISC
1.0000 [IU] | 3 refills | Status: DC
Start: 2024-04-17 — End: 2024-04-19

## 2024-04-17 MED ORDER — CYANOCOBALAMIN 1000 MCG/ML IJ SOLN: INTRAMUSCULAR | 6 refills | Status: AC

## 2024-04-17 MED ORDER — INSULIN PEN NEEDLE 31G X 5 MM MISC: 11 refills | Status: AC

## 2024-04-17 NOTE — Telephone Encounter (Signed)
 Pharmacy Patient Advocate Encounter  Received notification from [facility] that Prior Authorization for Dexcom G6 Sensor  has been APPROVED from 04/16/24 to 04/16/25. Ran test claim, Copay is $141. This test claim was processed through [facility]- copay amounts may vary at other pharmacies due to pharmacy/plan contracts, or as the patient moves through the different stages of their insurance plan.   PA #/Case ID/Reference #: [id number]  *spoke with [facility] to process

## 2024-04-19 MED ORDER — FREESTYLE LIBRE 3 PLUS SENSOR MISC: 5 refills | Status: AC

## 2024-07-03 ENCOUNTER — Ambulatory Visit: Admitting: Internal Medicine

## 2024-07-03 VITALS — BP 140/92 | HR 65 | Temp 98.4°F | Ht 68.5 in | Wt 185.6 lb

## 2024-07-03 DIAGNOSIS — R5383 Other fatigue: Secondary | ICD-10-CM

## 2024-07-03 DIAGNOSIS — E559 Vitamin D deficiency, unspecified: Secondary | ICD-10-CM

## 2024-07-03 DIAGNOSIS — E538 Deficiency of other specified B group vitamins: Secondary | ICD-10-CM

## 2024-07-03 DIAGNOSIS — E1165 Type 2 diabetes mellitus with hyperglycemia: Secondary | ICD-10-CM

## 2024-07-03 DIAGNOSIS — E782 Mixed hyperlipidemia: Secondary | ICD-10-CM | POA: Diagnosis not present

## 2024-07-03 DIAGNOSIS — B351 Tinea unguium: Secondary | ICD-10-CM

## 2024-07-03 MED ORDER — TIRZEPATIDE 2.5 MG/0.5ML ~~LOC~~ SOAJ: 2.5000 mg | SUBCUTANEOUS | 3 refills | Status: AC

## 2024-07-03 MED ORDER — CICLOPIROX 8 % EX SOLN: Freq: Every day | CUTANEOUS | 1 refills | Status: AC

## 2024-07-03 NOTE — Progress Notes (Signed)
 Subjective:  Patient ID: Mitchell Gutierrez, male    DOB: [birth year]  Age: 53 y.o. MRN: [id number]  CC: Follow-up (Patient states on his big toe on left foot toe nail is turning black. Noticed a few weeks ago. No pain. States nail feels like it is getting rough. )   HPI Mitchell Gutierrez presents for DM, HTN A1c 7.2% Complaining of nail fungus, weight gain  Outpatient Medications Prior to Visit  Medication Sig Dispense Refill   Continuous Glucose Sensor (FREESTYLE LIBRE 3 PLUS SENSOR) MISC Change sensor every 15 days. 6 each 5   cyanocobalamin  (VITAMIN B12) 1000 MCG/ML injection Give 1000 mcg of Vitamin b12 sq daily x 1 week, then weekly for 1 month, then once every 2 weeks 10 mL 6   insulin  aspart (NOVOLOG  FLEXPEN) 100 UNIT/ML FlexPen Sugar <150 - no additional units Sugar <151-200 - 2 additional units Sugar <200-250 - 6 additional units Sugar <251-300 - 8 additional units Sugar <351-350 - 10 additional units.  Use up to 4 times a day, MAX dose is 40 units/day. 15 mL 11   insulin  degludec (TRESIBA  FLEXTOUCH) 100 UNIT/ML FlexTouch Pen Inject 30 Units into the skin daily. 9 mL 11   Insulin  Pen Needle 31G X 5 MM MISC Use as directed prn 100 each 11   omega-3 acid ethyl esters (LOVAZA ) 1 g capsule Take 2 capsules by mouth 2 (two) times daily.     icosapent  Ethyl (VASCEPA ) 1 g capsule Take 2 capsules (2 g total) by mouth 2 (two) times daily. (Patient not taking: Reported on 07/03/2024) 360 capsule 3   No facility-administered medications prior to visit.    ROS: Review of Systems  Constitutional:  Negative for appetite change, fatigue and unexpected weight change.  HENT:  Negative for congestion, nosebleeds, sneezing, sore throat and trouble swallowing.   Eyes:  Negative for itching and visual disturbance.  Respiratory:  Negative for cough.   Cardiovascular:  Negative for chest pain, palpitations and leg swelling.  Gastrointestinal:  Negative for abdominal distention, blood in stool, diarrhea and  nausea.  Genitourinary:  Negative for frequency and hematuria.  Musculoskeletal:  Negative for back pain, gait problem, joint swelling and neck pain.  Skin:  Negative for rash.  Neurological:  Negative for dizziness, tremors, speech difficulty and weakness.  Psychiatric/Behavioral:  Negative for agitation, dysphoric mood and sleep disturbance. The patient is not nervous/anxious.     Objective:  BP (!) 140/92   Pulse 65   Temp 98.4 F (36.9 C) (Oral)   Ht 5' 8.5 (1.74 m)   Wt 185 lb 9.6 oz (84.2 kg)   SpO2 93%   BMI 27.81 kg/m   BP Readings from Last 3 Encounters:  07/03/24 (!) 140/92  04/02/24 110/76  12/29/23 128/78    Wt Readings from Last 3 Encounters:  07/03/24 185 lb 9.6 oz (84.2 kg)  04/02/24 182 lb (82.6 kg)  12/29/23 189 lb 9.6 oz (86 kg)    Physical Exam Constitutional:      General: He is not in acute distress.    Appearance: He is well-developed.     Comments: NAD  Eyes:     Conjunctiva/sclera: Conjunctivae normal.     Pupils: Pupils are equal, round, and reactive to light.  Neck:     Thyroid : No thyromegaly.     Vascular: No JVD.  Cardiovascular:     Rate and Rhythm: Normal rate and regular rhythm.     Heart sounds: Normal heart sounds. No murmur heard.  No friction rub. No gallop.  Pulmonary:     Effort: Pulmonary effort is normal. No respiratory distress.     Breath sounds: Normal breath sounds. No wheezing or rales.  Chest:     Chest wall: No tenderness.  Abdominal:     General: Bowel sounds are normal. There is no distension.     Palpations: Abdomen is soft. There is no mass.     Tenderness: There is no abdominal tenderness. There is no guarding or rebound.  Musculoskeletal:        General: No tenderness. Normal range of motion.     Cervical back: Normal range of motion.  Lymphadenopathy:     Cervical: No cervical adenopathy.  Skin:    General: Skin is warm and dry.     Findings: No rash.  Neurological:     Mental Status: He is alert  and oriented to person, place, and time.     Cranial Nerves: No cranial nerve deficit.     Motor: No abnormal muscle tone.     Coordination: Coordination normal.     Gait: Gait normal.     Deep Tendon Reflexes: Reflexes are normal and symmetric.  Psychiatric:        Behavior: Behavior normal.        Thought Content: Thought content normal.        Judgment: Judgment normal.     Lab Results  Component Value Date   WBC 3.0 (L) 12/22/2022   HGB 14.8 12/22/2022   HCT 44.9 12/22/2022   PLT 184.0 12/22/2022   GLUCOSE 158 (H) 07/03/2024   CHOL 204 (H) 07/03/2024   TRIG (H) 07/03/2024    869.0 Triglyceride is over 400; calculations on Lipids are invalid.   HDL 23.00 (L) 07/03/2024   LDLDIRECT 106.0 07/03/2024   LDLCALC 115 (H) 09/26/2023   ALT 15 07/03/2024   AST 15 07/03/2024   NA 137 07/03/2024   K 4.1 07/03/2024   CL 104 07/03/2024   CREATININE 1.24 07/03/2024   BUN 23 07/03/2024   CO2 24 07/03/2024   TSH 2.63 12/22/2022   PSA 0.69 04/02/2024   HGBA1C 9.0 (H) 07/03/2024    CT CARDIAC SCORING ([facility] LOCATIONS ONLY) Result Date: 01/24/2024 CLINICAL DATA:  Dyslipidemia, family history * Tracking Code: FCC * EXAM: CT CARDIAC CORONARY ARTERY CALCIUM SCORE TECHNIQUE: Non-contrast imaging through the heart was performed using prospective ECG gating. Image post processing was performed on an independent workstation, allowing for quantitative analysis of the heart and coronary arteries. Note that this exam targets the heart and the chest was not imaged in its entirety. COMPARISON:  None available. FINDINGS: CORONARY CALCIUM SCORES: Left Main: 0 LAD: 0 LCx: 0 RCA/PDA: 0 Total Agatston Score: 0 MESA database percentile: 0 AORTA MEASUREMENTS: Ascending Aorta: 3.1 cm Descending Aorta:2.3 cm OTHER FINDINGS: Heart is normal size. Aorta normal caliber. Calcifications in the aortic arch. No adenopathy. No confluent airspace opacities or effusions. Linear subsegmental atelectasis at the right lung  base. No acute findings in the upper abdomen. Low-density area within the superior pole of the spleen measures 1.8 cm, likely cyst or hemangioma. Chest wall soft tissues are unremarkable. No acute bony abnormality. IMPRESSION: No visible coronary artery calcifications. Total coronary calcium score of 0. Calcifications in the aortic arch. Right basilar atelectasis. No acute extra cardiac abnormality. Electronically Signed   By: Franky Crease M.D.   On: 01/24/2024 23:10    Assessment & Plan:   Problem List Items Addressed This Visit  B12 deficiency   On B12      Fatigue   Weight loss, regular exercise should help      Mixed hyperlipidemia   Continue with Lovaza .  Hopefully weight loss on Mounjaro  will help too      Onychomycosis   Toenails onychomycosis.  Penlac  prescribed      Relevant Medications   ciclopirox  (PENLAC ) 8 % solution   Uncontrolled type 2 diabetes mellitus with hyperglycemia (HCC) - Primary   Will add Mounjaro  weekly.  Continue with previous treatment      Relevant Medications   tirzepatide  (MOUNJARO ) 2.5 MG/0.5ML Pen   Other Relevant Orders   Hemoglobin A1c (Completed)   Comprehensive metabolic panel with GFR (Completed)   Vitamin D  deficiency      Meds ordered this encounter  Medications   tirzepatide  (MOUNJARO ) 2.5 MG/0.5ML Pen    Sig: Inject 2.5 mg into the skin once a week.    Dispense:  2 mL    Refill:  3   ciclopirox  (PENLAC ) 8 % solution    Sig: Apply topically at bedtime. Apply over nail and surrounding skin. Apply daily over previous coat. After seven (7) days, may remove with alcohol and continue cycle.    Dispense:  6.6 mL    Refill:  1      Follow-up: Return in about 3 months (around 10/02/2024) for a follow-up visit.  Marolyn Noel, MD

## 2024-07-04 ENCOUNTER — Telehealth: Payer: Self-pay

## 2024-07-04 ENCOUNTER — Other Ambulatory Visit (HOSPITAL_COMMUNITY): Payer: Self-pay

## 2024-07-04 LAB — COMPREHENSIVE METABOLIC PANEL WITH GFR
ALT: 15 U/L (ref 0–53)
AST: 15 U/L (ref 0–37)
Albumin: 4.8 g/dL (ref 3.5–5.2)
Alkaline Phosphatase: 51 U/L (ref 39–117)
BUN: 23 mg/dL (ref 6–23)
CO2: 24 meq/L (ref 19–32)
Calcium: 10.8 mg/dL — ABNORMAL HIGH (ref 8.4–10.5)
Chloride: 104 meq/L (ref 96–112)
Creatinine, Ser: 1.24 mg/dL (ref 0.40–1.50)
GFR: 66.08 mL/min (ref >60.00)
Glucose, Bld: 158 mg/dL — ABNORMAL HIGH (ref 70–99)
Potassium: 4.1 meq/L (ref 3.5–5.1)
Sodium: 137 meq/L (ref 135–145)
Total Bilirubin: 0.6 mg/dL (ref 0.2–1.2)
Total Protein: 6.7 g/dL (ref 6.0–8.3)

## 2024-07-04 LAB — HEMOGLOBIN A1C: Hgb A1c MFr Bld: 9 % — ABNORMAL HIGH (ref 4.6–6.5)

## 2024-07-04 LAB — LIPID PANEL
Cholesterol: 204 mg/dL — ABNORMAL HIGH (ref 0–200)
HDL: 23 mg/dL — ABNORMAL LOW (ref >39.00)
NonHDL: 180.96
Total CHOL/HDL Ratio: 9
Triglycerides: 869 mg/dL — ABNORMAL HIGH (ref 0.0–149.0)
VLDL: 173.8 mg/dL — ABNORMAL HIGH (ref 0.0–40.0)

## 2024-07-04 LAB — LDL CHOLESTEROL, DIRECT: Direct LDL: 106 mg/dL

## 2024-07-04 NOTE — Telephone Encounter (Signed)
 Pharmacy Patient Advocate Encounter   Received notification from CoverMyMeds that prior authorization for Ciclopirox  8% solution is required/requested.   Insurance verification completed.   The patient is insured through [employer] [facility] .   Per test claim: PA required; PA submitted to above mentioned insurance via Latent Key/confirmation #/EOC [facility] Status is pending

## 2024-07-05 ENCOUNTER — Other Ambulatory Visit (HOSPITAL_BASED_OUTPATIENT_CLINIC_OR_DEPARTMENT_OTHER): Payer: Self-pay

## 2024-07-05 NOTE — Telephone Encounter (Signed)
 Pharmacy Patient Advocate Encounter  Received notification from [employer] [facility] that Prior Authorization for Ciclopirox  8% solution  has been DENIED.  Full denial letter will be uploaded to the media tab. See denial reason below.   PA #/Case ID/Reference #: [id number]

## 2024-07-10 ENCOUNTER — Ambulatory Visit: Payer: Self-pay | Admitting: Internal Medicine

## 2024-07-10 ENCOUNTER — Other Ambulatory Visit: Payer: Self-pay | Admitting: Internal Medicine

## 2024-07-10 DIAGNOSIS — E1165 Type 2 diabetes mellitus with hyperglycemia: Secondary | ICD-10-CM

## 2024-07-10 DIAGNOSIS — E782 Mixed hyperlipidemia: Secondary | ICD-10-CM

## 2024-07-15 ENCOUNTER — Other Ambulatory Visit: Payer: Self-pay | Admitting: Internal Medicine

## 2024-07-15 DIAGNOSIS — B351 Tinea unguium: Secondary | ICD-10-CM | POA: Insufficient documentation

## 2024-07-15 NOTE — Assessment & Plan Note (Signed)
 Continue with Lovaza .  Hopefully weight loss on Mounjaro  will help too

## 2024-07-15 NOTE — Assessment & Plan Note (Signed)
 On B12

## 2024-07-15 NOTE — Assessment & Plan Note (Signed)
 Toenails onychomycosis.  Penlac  prescribed

## 2024-07-15 NOTE — Assessment & Plan Note (Signed)
 Will add Mounjaro  weekly.  Continue with previous treatment

## 2024-07-15 NOTE — Assessment & Plan Note (Signed)
 Weight loss, regular exercise should help

## 2024-09-20 ENCOUNTER — Other Ambulatory Visit (HOSPITAL_COMMUNITY): Payer: Self-pay

## 2024-09-20 ENCOUNTER — Telehealth: Payer: Self-pay

## 2024-09-20 NOTE — Telephone Encounter (Signed)
 Pharmacy Patient Advocate Encounter   Received notification from Onbase that prior authorization for Mounjaro  2.5MG /0.5ML auto-injectors  is required/requested.   Insurance verification completed.   The patient is insured through [employer].   Per test claim: PA required; PA submitted to above mentioned insurance via Latent Key/confirmation #/EOC [facility] Status is pending

## 2024-09-21 ENCOUNTER — Other Ambulatory Visit (HOSPITAL_COMMUNITY): Payer: Self-pay

## 2024-09-21 NOTE — Telephone Encounter (Signed)
 Pharmacy Patient Advocate Encounter  Received notification from [employer] [facility] that Prior Authorization for Mounjaro  2.5MG /0.5ML auto-injectors  has been APPROVED from 09/21/2024 to 09/22/2027   PA #/Case ID/Reference #: [id number]

## 2024-09-26 ENCOUNTER — Other Ambulatory Visit (HOSPITAL_COMMUNITY): Payer: Self-pay

## 2024-10-15 ENCOUNTER — Ambulatory Visit: Admitting: Internal Medicine

## 2024-11-08 ENCOUNTER — Other Ambulatory Visit: Payer: Self-pay | Admitting: Internal Medicine
